# Patient Record
Sex: Male | Born: 1980
Health system: Southern US, Community
[De-identification: ages and names within clinical notes are randomized; demographics above are authoritative.]

## PROBLEM LIST (undated history)

## (undated) DIAGNOSIS — I10 Essential (primary) hypertension: Secondary | ICD-10-CM

## (undated) DIAGNOSIS — K429 Umbilical hernia without obstruction or gangrene: Secondary | ICD-10-CM

## (undated) DIAGNOSIS — E119 Type 2 diabetes mellitus without complications: Secondary | ICD-10-CM

## (undated) HISTORY — PX: HERNIA REPAIR: SHX51

---

## 2020-06-23 DIAGNOSIS — Z720 Tobacco use: Secondary | ICD-10-CM | POA: Diagnosis not present

## 2020-06-23 DIAGNOSIS — I10 Essential (primary) hypertension: Secondary | ICD-10-CM | POA: Diagnosis not present

## 2020-06-23 DIAGNOSIS — K219 Gastro-esophageal reflux disease without esophagitis: Secondary | ICD-10-CM | POA: Diagnosis not present

## 2020-06-23 DIAGNOSIS — G43909 Migraine, unspecified, not intractable, without status migrainosus: Secondary | ICD-10-CM | POA: Diagnosis not present

## 2020-07-05 DIAGNOSIS — E1165 Type 2 diabetes mellitus with hyperglycemia: Secondary | ICD-10-CM | POA: Diagnosis not present

## 2020-07-05 DIAGNOSIS — Z0000 Encounter for general adult medical examination without abnormal findings: Secondary | ICD-10-CM | POA: Diagnosis not present

## 2020-07-05 DIAGNOSIS — Z1321 Encounter for screening for nutritional disorder: Secondary | ICD-10-CM | POA: Diagnosis not present

## 2020-07-08 DIAGNOSIS — E1165 Type 2 diabetes mellitus with hyperglycemia: Secondary | ICD-10-CM | POA: Diagnosis not present

## 2020-07-08 DIAGNOSIS — Z0000 Encounter for general adult medical examination without abnormal findings: Secondary | ICD-10-CM | POA: Diagnosis not present

## 2020-07-08 DIAGNOSIS — G2581 Restless legs syndrome: Secondary | ICD-10-CM | POA: Diagnosis not present

## 2020-07-08 DIAGNOSIS — E782 Mixed hyperlipidemia: Secondary | ICD-10-CM | POA: Diagnosis not present

## 2020-07-21 DIAGNOSIS — E785 Hyperlipidemia, unspecified: Secondary | ICD-10-CM | POA: Diagnosis not present

## 2020-07-21 DIAGNOSIS — E1165 Type 2 diabetes mellitus with hyperglycemia: Secondary | ICD-10-CM | POA: Diagnosis not present

## 2020-08-26 ENCOUNTER — Other Ambulatory Visit: Payer: Self-pay

## 2020-08-26 ENCOUNTER — Emergency Department (HOSPITAL_COMMUNITY): Payer: BC Managed Care – PPO

## 2020-08-26 ENCOUNTER — Observation Stay (HOSPITAL_COMMUNITY)
Admission: EM | Admit: 2020-08-26 | Discharge: 2020-08-28 | Disposition: A | Discharge status: Home / Self Care | Payer: BC Managed Care – PPO | Attending: Surgery | Admitting: Surgery

## 2020-08-26 ENCOUNTER — Encounter (HOSPITAL_COMMUNITY): Payer: Self-pay

## 2020-08-26 DIAGNOSIS — Z6837 Body mass index (BMI) 37.0-37.9, adult: Secondary | ICD-10-CM

## 2020-08-26 DIAGNOSIS — K436 Other and unspecified ventral hernia with obstruction, without gangrene: Secondary | ICD-10-CM

## 2020-08-26 DIAGNOSIS — I10 Essential (primary) hypertension: Secondary | ICD-10-CM | POA: Diagnosis not present

## 2020-08-26 DIAGNOSIS — K432 Incisional hernia without obstruction or gangrene: Principal | ICD-10-CM | POA: Insufficient documentation

## 2020-08-26 DIAGNOSIS — Z79899 Other long term (current) drug therapy: Secondary | ICD-10-CM

## 2020-08-26 DIAGNOSIS — K56609 Unspecified intestinal obstruction, unspecified as to partial versus complete obstruction: Secondary | ICD-10-CM | POA: Diagnosis not present

## 2020-08-26 DIAGNOSIS — K430 Incisional hernia with obstruction, without gangrene: Principal | ICD-10-CM | POA: Diagnosis present

## 2020-08-26 DIAGNOSIS — K6389 Other specified diseases of intestine: Secondary | ICD-10-CM | POA: Diagnosis not present

## 2020-08-26 DIAGNOSIS — Z720 Tobacco use: Secondary | ICD-10-CM

## 2020-08-26 DIAGNOSIS — K439 Ventral hernia without obstruction or gangrene: Secondary | ICD-10-CM | POA: Diagnosis not present

## 2020-08-26 DIAGNOSIS — K219 Gastro-esophageal reflux disease without esophagitis: Secondary | ICD-10-CM | POA: Diagnosis present

## 2020-08-26 DIAGNOSIS — E669 Obesity, unspecified: Secondary | ICD-10-CM | POA: Diagnosis present

## 2020-08-26 DIAGNOSIS — E119 Type 2 diabetes mellitus without complications: Secondary | ICD-10-CM | POA: Diagnosis not present

## 2020-08-26 DIAGNOSIS — Z20822 Contact with and (suspected) exposure to covid-19: Secondary | ICD-10-CM | POA: Diagnosis not present

## 2020-08-26 DIAGNOSIS — K5900 Constipation, unspecified: Secondary | ICD-10-CM | POA: Diagnosis not present

## 2020-08-26 DIAGNOSIS — K5939 Other megacolon: Secondary | ICD-10-CM | POA: Diagnosis not present

## 2020-08-26 DIAGNOSIS — Z7984 Long term (current) use of oral hypoglycemic drugs: Secondary | ICD-10-CM

## 2020-08-26 DIAGNOSIS — R109 Unspecified abdominal pain: Secondary | ICD-10-CM | POA: Diagnosis not present

## 2020-08-26 DIAGNOSIS — F17210 Nicotine dependence, cigarettes, uncomplicated: Secondary | ICD-10-CM | POA: Diagnosis present

## 2020-08-26 DIAGNOSIS — F32A Depression, unspecified: Secondary | ICD-10-CM

## 2020-08-26 DIAGNOSIS — E7800 Pure hypercholesterolemia, unspecified: Secondary | ICD-10-CM

## 2020-08-26 HISTORY — DX: Essential (primary) hypertension: I10

## 2020-08-26 HISTORY — DX: Umbilical hernia without obstruction or gangrene: K42.9

## 2020-08-26 HISTORY — DX: Type 2 diabetes mellitus without complications: E11.9

## 2020-08-26 LAB — CBC
HCT: 44.2 % (ref 39.0–52.0)
Hemoglobin: 14 g/dL (ref 13.0–17.0)
MCH: 26.2 pg (ref 26.0–34.0)
MCHC: 31.7 g/dL (ref 30.0–36.0)
MCV: 82.6 fL (ref 80.0–100.0)
Platelets: 318 10*3/uL (ref 150–400)
RBC: 5.35 MIL/uL (ref 4.22–5.81)
RDW: 14.4 % (ref 11.5–15.5)
WBC: 12.3 10*3/uL — ABNORMAL HIGH (ref 4.0–10.5)
nRBC: 0 % (ref 0.0–0.2)

## 2020-08-26 LAB — URINALYSIS, ROUTINE W REFLEX MICROSCOPIC
Bacteria, UA: NONE SEEN
Bilirubin Urine: NEGATIVE
Glucose, UA: NEGATIVE mg/dL
Hgb urine dipstick: NEGATIVE
Ketones, ur: 20 mg/dL — AB
Leukocytes,Ua: NEGATIVE
Nitrite: NEGATIVE
Protein, ur: 30 mg/dL — AB
Specific Gravity, Urine: 1.026 (ref 1.005–1.030)
pH: 6 (ref 5.0–8.0)

## 2020-08-26 LAB — COMPREHENSIVE METABOLIC PANEL
ALT: 56 U/L — ABNORMAL HIGH (ref 0–44)
AST: 31 U/L (ref 15–41)
Albumin: 4.1 g/dL (ref 3.5–5.0)
Alkaline Phosphatase: 81 U/L (ref 38–126)
Anion gap: 11 (ref 5–15)
BUN: 15 mg/dL (ref 6–20)
CO2: 25 mmol/L (ref 22–32)
Calcium: 9.7 mg/dL (ref 8.9–10.3)
Chloride: 100 mmol/L (ref 98–111)
Creatinine, Ser: 0.94 mg/dL (ref 0.61–1.24)
GFR, Estimated: >60 mL/min (ref >60)
Glucose, Bld: 136 mg/dL — ABNORMAL HIGH (ref 70–99)
Potassium: 3.9 mmol/L (ref 3.5–5.1)
Sodium: 136 mmol/L (ref 135–145)
Total Bilirubin: 0.4 mg/dL (ref 0.3–1.2)
Total Protein: 8 g/dL (ref 6.5–8.1)

## 2020-08-26 LAB — LIPASE, BLOOD: Lipase: 51 U/L (ref 11–51)

## 2020-08-26 LAB — CBG MONITORING, ED: Glucose-Capillary: 114 mg/dL — ABNORMAL HIGH (ref 70–99)

## 2020-08-26 MED ORDER — SODIUM CHLORIDE 0.9 % IV BOLUS
1000.0000 mL | Freq: Once | INTRAVENOUS | Status: AC
  Administered 2020-08-26: 1000 mL via INTRAVENOUS

## 2020-08-26 MED ORDER — ONDANSETRON HCL 4 MG/2ML IJ SOLN
4.0000 mg | Freq: Four times a day (QID) | INTRAMUSCULAR | Status: DC | PRN
  Administered 2020-08-26: 4 mg via INTRAVENOUS
  Filled 2020-08-26: qty 2

## 2020-08-26 MED ORDER — ONDANSETRON 4 MG PO TBDP: 4.0000 mg | ORAL_TABLET | Freq: Four times a day (QID) | ORAL | Status: DC | PRN

## 2020-08-26 MED ORDER — LISINOPRIL-HYDROCHLOROTHIAZIDE 20-12.5 MG PO TABS: 1.0000 | ORAL_TABLET | Freq: Every day | ORAL | Status: DC

## 2020-08-26 MED ORDER — METOPROLOL SUCCINATE ER 50 MG PO TB24
50.0000 mg | ORAL_TABLET | Freq: Every evening | ORAL | Status: DC
  Administered 2020-08-26: 50 mg via ORAL
  Filled 2020-08-26 (×2): qty 1

## 2020-08-26 MED ORDER — LISINOPRIL 20 MG PO TABS
20.0000 mg | ORAL_TABLET | Freq: Every day | ORAL | Status: DC
  Administered 2020-08-26 – 2020-08-27 (×2): 20 mg via ORAL
  Filled 2020-08-26 (×2): qty 1

## 2020-08-26 MED ORDER — METFORMIN HCL 500 MG PO TABS
500.0000 mg | ORAL_TABLET | Freq: Every day | ORAL | Status: DC
  Administered 2020-08-27: 500 mg via ORAL
  Filled 2020-08-26: qty 1

## 2020-08-26 MED ORDER — INSULIN ASPART 100 UNIT/ML IJ SOLN
0.0000 [IU] | INTRAMUSCULAR | Status: DC
Start: 2020-08-26 — End: 2020-08-28
  Administered 2020-08-27: 5 [IU] via SUBCUTANEOUS
  Administered 2020-08-27: 3 [IU] via SUBCUTANEOUS
  Administered 2020-08-27: 5 [IU] via SUBCUTANEOUS
  Administered 2020-08-28 (×2): 2 [IU] via SUBCUTANEOUS
  Administered 2020-08-28: 5 [IU] via SUBCUTANEOUS
  Filled 2020-08-26: qty 0.15

## 2020-08-26 MED ORDER — ONDANSETRON HCL 4 MG/2ML IJ SOLN
4.0000 mg | Freq: Once | INTRAMUSCULAR | Status: AC
  Administered 2020-08-26: 4 mg via INTRAVENOUS
  Filled 2020-08-26: qty 2

## 2020-08-26 MED ORDER — PANTOPRAZOLE SODIUM 40 MG IV SOLR
40.0000 mg | INTRAVENOUS | Status: DC
  Administered 2020-08-26 – 2020-08-27 (×2): 40 mg via INTRAVENOUS
  Filled 2020-08-26 (×2): qty 40

## 2020-08-26 MED ORDER — AMITRIPTYLINE HCL 10 MG PO TABS
10.0000 mg | ORAL_TABLET | Freq: Every day | ORAL | Status: DC
  Administered 2020-08-26 – 2020-08-27 (×2): 10 mg via ORAL
  Filled 2020-08-26 (×2): qty 1

## 2020-08-26 MED ORDER — ACETAMINOPHEN 650 MG RE SUPP: 650.0000 mg | Freq: Four times a day (QID) | RECTAL | Status: DC | PRN

## 2020-08-26 MED ORDER — OXYCODONE HCL 5 MG PO TABS
5.0000 mg | ORAL_TABLET | ORAL | Status: DC | PRN
  Administered 2020-08-27: 5 mg via ORAL
  Filled 2020-08-26: qty 1
  Filled 2020-08-26: qty 2

## 2020-08-26 MED ORDER — HYDROCHLOROTHIAZIDE 12.5 MG PO CAPS
12.5000 mg | ORAL_CAPSULE | Freq: Every day | ORAL | Status: DC
  Administered 2020-08-26 – 2020-08-27 (×2): 12.5 mg via ORAL
  Filled 2020-08-26 (×2): qty 1

## 2020-08-26 MED ORDER — ONDANSETRON 4 MG PO TBDP: 4.0000 mg | ORAL_TABLET | Freq: Once | ORAL | Status: DC

## 2020-08-26 MED ORDER — POTASSIUM CHLORIDE IN NACL 20-0.45 MEQ/L-% IV SOLN
INTRAVENOUS | Status: DC
  Filled 2020-08-26 (×4): qty 1000

## 2020-08-26 MED ORDER — ACETAMINOPHEN 325 MG PO TABS: 650.0000 mg | ORAL_TABLET | Freq: Four times a day (QID) | ORAL | Status: DC | PRN

## 2020-08-26 MED ORDER — HYDROMORPHONE HCL 1 MG/ML IJ SOLN
1.0000 mg | INTRAMUSCULAR | Status: DC | PRN
  Administered 2020-08-26: 1 mg via INTRAVENOUS
  Administered 2020-08-27 (×2): .5 mg via INTRAVENOUS
  Administered 2020-08-27: 1 mg via INTRAVENOUS
  Filled 2020-08-26 (×2): qty 1

## 2020-08-26 NOTE — ED Provider Notes (Signed)
Emergency Medicine Provider Triage Evaluation Note  Gregory Castillo , a 40 y.o. male  was evaluated in triage.  Pt complains of constipation, periumbilical abdominal pain. Hx of ventral hernia repair. N with single episode of NBNB emesis. No fevers or chills. Onset of pain today. Constipation x 3 days.   Review of Systems  Positive: Abdominal pain, constipation, N, V Negative: D, melena or hematochezia, SOB, CP  Physical Exam  BP (!) 150/93 (BP Location: Right Arm)   Pulse (!) 101   Temp 98.1 F (36.7 C) (Oral)   Resp 18   Ht 5\' 11"  (1.803 m)   Wt 123.5 kg   SpO2 99%   BMI 37.96 kg/m  Gen:   Awake, no distress   Resp:  Normal effort  MSK:   Moves extremities without difficulty  Other:  Surgical scar above umbilicus, bulge above umbilicus with TTP  Medical Decision Making  Medically screening exam initiated at 4:42 PM.  Appropriate orders placed.  Gregory Castillo was informed that the remainder of the evaluation will be completed by another provider, this initial triage assessment does not replace that evaluation, and the importance of remaining in the ED until their evaluation is complete.  This chart was dictated using voice recognition software, Dragon. Despite the best efforts of this provider to proofread and correct errors, errors may still occur which can change documentation meaning.    Renato Shin 08/26/20 1645    08/28/20, MD 08/26/20 2252

## 2020-08-26 NOTE — ED Provider Notes (Signed)
[facility]-EMERGENCY DEPT Provider Note   CSN: [id number] Arrival date & time: 08/26/20  1538     History Chief Complaint  Patient presents with  . Abdominal Pain    Gregory Castillo is a 40 y.o. male.  Patient presents with 'recurrent hernia', pain at site and nausea/vomiting. States ~ 1 year ago had umbilical hernia repair in [place]. States he was moving shortly thereafter and had done some lifting in that week after his surgery then. Pt felt his hernia had recurred with occasional bulging in area. In past few days, some straining to have bm as felt mildly constipated. Did have bm this AM, but has noticed persistent pain at hernia site, w persistent bulge, and episodes of nausea/vomiting today. Emesis not bloody or bilious. No fever or chills.   The history is provided by the patient.  Abdominal Pain Associated symptoms: nausea and vomiting   Associated symptoms: no chest pain, no fever, no shortness of breath and no sore throat        Past Medical History:  Diagnosis Date  . Diabetes mellitus without complication (HCC)   . Hypertension   . Umbilical hernia     There are no problems to display for this patient.   Past Surgical History:  Procedure Laterality Date  . HERNIA REPAIR         Family History  Family history unknown: Yes    Social History   Tobacco Use  . Smoking status: Current Every Day Smoker    Packs/day: 1.00    Types: Cigarettes  . Smokeless tobacco: Never Used  Vaping Use  . Vaping Use: Never used  Substance Use Topics  . Alcohol use: Never  . Drug use: Never    Home Medications Prior to Admission medications   Not on File    Allergies    Patient has no known allergies.  Review of Systems   Review of Systems  Constitutional: Negative for fever.  HENT: Negative for sore throat.   Eyes: Negative for redness.  Respiratory: Negative for shortness of breath.   Cardiovascular: Negative for chest pain.   Gastrointestinal: Positive for abdominal pain, nausea and vomiting.  Genitourinary: Negative for flank pain.  Musculoskeletal: Negative for back pain and neck pain.  Skin: Negative for rash.  Neurological: Negative for headaches.  Hematological: Does not bruise/bleed easily.  Psychiatric/Behavioral: Negative for confusion.    Physical Exam Updated Vital Signs BP 136/83   Pulse 87   Temp 98.1 F (36.7 C) (Oral)   Resp 16   Ht 1.803 m (5\' 11" )   Wt 123.5 kg   SpO2 94%   BMI 37.96 kg/m   Physical Exam Vitals and nursing note reviewed.  Constitutional:      Appearance: Normal appearance. He is well-developed.  HENT:     Head: Atraumatic.     Nose: Nose normal.     Mouth/Throat:     Mouth: Mucous membranes are moist.     Pharynx: Oropharynx is clear.  Eyes:     General: No scleral icterus.    Conjunctiva/sclera: Conjunctivae normal.  Neck:     Trachea: No tracheal deviation.  Cardiovascular:     Rate and Rhythm: Normal rate and regular rhythm.     Pulses: Normal pulses.     Heart sounds: Normal heart sounds. No murmur heard. No friction rub. No gallop.   Pulmonary:     Effort: Pulmonary effort is normal. No accessory muscle usage or respiratory distress.  Breath sounds: Normal breath sounds.  Abdominal:     General: Bowel sounds are normal. There is no distension.     Palpations: Abdomen is soft. There is no mass.     Tenderness: There is abdominal tenderness. There is no guarding or rebound.     Hernia: A hernia is present.     Comments: Tender, non-reducible hernia just above umbilicus.   Genitourinary:    Comments: No cva tenderness. Musculoskeletal:        General: No swelling.     Cervical back: Normal range of motion and neck supple. No rigidity.  Skin:    General: Skin is warm and dry.     Findings: No rash.  Neurological:     Mental Status: He is alert.     Comments: Alert, speech clear.   Psychiatric:        Mood and Affect: Mood normal.      ED Results / Procedures / Treatments   Labs (all labs ordered are listed, but only abnormal results are displayed) Results for orders placed or performed during the hospital encounter of 08/26/20  Lipase, blood  Result Value Ref Range   Lipase 51 11 - 51 U/L  Comprehensive metabolic panel  Result Value Ref Range   Sodium 136 135 - 145 mmol/L   Potassium 3.9 3.5 - 5.1 mmol/L   Chloride 100 98 - 111 mmol/L   CO2 25 22 - 32 mmol/L   Glucose, Bld 136 (H) 70 - 99 mg/dL   BUN 15 6 - 20 mg/dL   Creatinine, Ser 3.74 0.61 - 1.24 mg/dL   Calcium 9.7 8.9 - 82.7 mg/dL   Total Protein 8.0 6.5 - 8.1 g/dL   Albumin 4.1 3.5 - 5.0 g/dL   AST 31 15 - 41 U/L   ALT 56 (H) 0 - 44 U/L   Alkaline Phosphatase 81 38 - 126 U/L   Total Bilirubin 0.4 0.3 - 1.2 mg/dL   GFR, Estimated >07 >86 mL/min   Anion gap 11 5 - 15  CBC  Result Value Ref Range   WBC 12.3 (H) 4.0 - 10.5 K/uL   RBC 5.35 4.22 - 5.81 MIL/uL   Hemoglobin 14.0 13.0 - 17.0 g/dL   HCT 75.4 49.2 - 01.0 %   MCV 82.6 80.0 - 100.0 fL   MCH 26.2 26.0 - 34.0 pg   MCHC 31.7 30.0 - 36.0 g/dL   RDW 07.1 21.9 - 75.8 %   Platelets 318 150 - 400 K/uL   nRBC 0.0 0.0 - 0.2 %  Urinalysis, Routine w reflex microscopic Urine, Clean Catch  Result Value Ref Range   Color, Urine YELLOW YELLOW   APPearance CLEAR CLEAR   Specific Gravity, Urine 1.026 1.005 - 1.030   pH 6.0 5.0 - 8.0   Glucose, UA NEGATIVE NEGATIVE mg/dL   Hgb urine dipstick NEGATIVE NEGATIVE   Bilirubin Urine NEGATIVE NEGATIVE   Ketones, ur 20 (A) NEGATIVE mg/dL   Protein, ur 30 (A) NEGATIVE mg/dL   Nitrite NEGATIVE NEGATIVE   Leukocytes,Ua NEGATIVE NEGATIVE   RBC / HPF 0-5 0 - 5 RBC/hpf   WBC, UA 0-5 0 - 5 WBC/hpf   Bacteria, UA NONE SEEN NONE SEEN   Mucus PRESENT    Hyaline Casts, UA PRESENT    CT Abdomen Pelvis Wo Contrast  Result Date: 08/26/2020 CLINICAL DATA:  Periumbilical abdominal pain, constipation. EXAM: CT ABDOMEN AND PELVIS WITHOUT CONTRAST TECHNIQUE:  Multidetector CT imaging of the abdomen and pelvis  was performed following the standard protocol without IV contrast. COMPARISON:  None. FINDINGS: Lower chest: No acute abnormality. Hepatobiliary: No focal liver abnormality is seen. No gallstones, gallbladder wall thickening, or biliary dilatation. Pancreas: Unremarkable. No pancreatic ductal dilatation or surrounding inflammatory changes. Spleen: Normal in size without focal abnormality. Adrenals/Urinary Tract: Adrenal glands are unremarkable. Kidneys are normal, without renal calculi, focal lesion, or hydronephrosis. Bladder is unremarkable. Stomach/Bowel: The stomach appears normal. Diverticulosis is noted throughout the colon without inflammation. Large supraumbilical ventral hernia is noted which contains a loop of small bowel; this results in mild dilatation of the more proximal small bowel loops suggesting some degree of obstruction. Vascular/Lymphatic: No significant vascular findings are present. No enlarged abdominal or pelvic lymph nodes. Reproductive: Prostate is unremarkable. Other: No ascites is noted. Musculoskeletal: No acute or significant osseous findings. IMPRESSION: Large supraumbilical ventral hernia is noted which contains a loop of small bowel, which results in mild dilatation of the more proximal small bowel loop suggesting some degree of obstruction. Diverticulosis is noted throughout the colon without inflammation. Electronically Signed   By: Lupita Raider M.D.   On: 08/26/2020 17:34    EKG None  Radiology CT Abdomen Pelvis Wo Contrast  Result Date: 08/26/2020 CLINICAL DATA:  Periumbilical abdominal pain, constipation. EXAM: CT ABDOMEN AND PELVIS WITHOUT CONTRAST TECHNIQUE: Multidetector CT imaging of the abdomen and pelvis was performed following the standard protocol without IV contrast. COMPARISON:  None. FINDINGS: Lower chest: No acute abnormality. Hepatobiliary: No focal liver abnormality is seen. No gallstones, gallbladder  wall thickening, or biliary dilatation. Pancreas: Unremarkable. No pancreatic ductal dilatation or surrounding inflammatory changes. Spleen: Normal in size without focal abnormality. Adrenals/Urinary Tract: Adrenal glands are unremarkable. Kidneys are normal, without renal calculi, focal lesion, or hydronephrosis. Bladder is unremarkable. Stomach/Bowel: The stomach appears normal. Diverticulosis is noted throughout the colon without inflammation. Large supraumbilical ventral hernia is noted which contains a loop of small bowel; this results in mild dilatation of the more proximal small bowel loops suggesting some degree of obstruction. Vascular/Lymphatic: No significant vascular findings are present. No enlarged abdominal or pelvic lymph nodes. Reproductive: Prostate is unremarkable. Other: No ascites is noted. Musculoskeletal: No acute or significant osseous findings. IMPRESSION: Large supraumbilical ventral hernia is noted which contains a loop of small bowel, which results in mild dilatation of the more proximal small bowel loop suggesting some degree of obstruction. Diverticulosis is noted throughout the colon without inflammation. Electronically Signed   By: Lupita Raider M.D.   On: 08/26/2020 17:34    Procedures Procedures   Medications Ordered in ED Medications  ondansetron (ZOFRAN) injection 4 mg (has no administration in time range)  sodium chloride 0.9 % bolus 1,000 mL (has no administration in time range)    ED Course  I have reviewed the triage vital signs and the nursing notes.  Pertinent labs & imaging results that were available during my care of the patient were reviewed by me and considered in my medical decision making (see chart for details).    MDM Rules/Calculators/A&P                         Iv ns bolus. zofran iv. Labs and imaging.   Reviewed nursing notes and prior charts for additional history.   Labs reviewed/interpreted by me - wbc mildly elevated, 12.   CT  reviewed/interpreted by me - ventral hernia w ?dilated loop bowel in hernia.   Attempt to reduce - even with  very gentle manipulation pt tolerates very poorly, requests abort.  Pt declines any opiate type pain medication.   General surgery consulted. Discussed w Dr Gerrit FriendsGerkin - he will see in ED/admit, possible OR.     Final Clinical Impression(s) / ED Diagnoses Final diagnoses:  None    Rx / DC Orders ED Discharge Orders    None       Cathren LaineSteinl, Reilly Molchan, MD 08/27/20 1015

## 2020-08-26 NOTE — H&P (Addendum)
Gregory Castillo is an 40 y.o. male.    General Surgery [facility]  Chief Complaint: abdominal pain, nausea, emesis  HPI: Patient is a 40 year old male who presents to the emergency department with onset of mid abdominal pain, nausea, and emesis due to a ventral hernia.  Patient has a history of hernia repair in [place], [place], approximately 1 year ago.  Patient and his wife do not believe that mesh was used in the repair.  He apparently had a recurrence relatively shortly after repair.  This is gradually enlarged.  Over the past few days the patient has suffered from constipation.  While straining with bowel movement he experienced onset of acute pain at the site of his hernia.  This has persisted.  Today he developed nausea and an episode of emesis.  He presented to the emergency department for evaluation.  CT scan of the abdomen and pelvis shows an incisional hernia in the midline of the abdomen above the level of the umbilicus containing a loop of small intestine with possible partial obstruction.  General surgery is now called for management.  Past medical history is notable for diabetes.  Patient has not had other abdominal surgery.  Patient works for a [employer].  Past Medical History:  Diagnosis Date  . Diabetes mellitus without complication (HCC)   . Hypertension   . Umbilical hernia     Past Surgical History:  Procedure Laterality Date  . HERNIA REPAIR      Family History  Family history unknown: Yes   Social History:  reports that he has been smoking cigarettes. He has been smoking about 1.00 pack per day. He has never used smokeless tobacco. He reports that he does not drink alcohol and does not use drugs.  Allergies: No Known Allergies  (Not in a hospital admission)   Results for orders placed or performed during the hospital encounter of 08/26/20 (from the past 48 hour(s))  Urinalysis, Routine w reflex microscopic Urine,  Clean Catch     Status: Abnormal   Collection Time: 08/26/20  4:08 PM  Result Value Ref Range   Color, Urine YELLOW YELLOW   APPearance CLEAR CLEAR   Specific Gravity, Urine 1.026 1.005 - 1.030   pH 6.0 5.0 - 8.0   Glucose, UA NEGATIVE NEGATIVE mg/dL   Hgb urine dipstick NEGATIVE NEGATIVE   Bilirubin Urine NEGATIVE NEGATIVE   Ketones, ur 20 (A) NEGATIVE mg/dL   Protein, ur 30 (A) NEGATIVE mg/dL   Nitrite NEGATIVE NEGATIVE   Leukocytes,Ua NEGATIVE NEGATIVE   RBC / HPF 0-5 0 - 5 RBC/hpf   WBC, UA 0-5 0 - 5 WBC/hpf   Bacteria, UA NONE SEEN NONE SEEN   Mucus PRESENT    Hyaline Casts, UA PRESENT     Comment: Performed at [facility], [address], [place], [place] [address]  Lipase, blood     Status: None   Collection Time: 08/26/20  4:16 PM  Result Value Ref Range   Lipase 51 11 - 51 U/L    Comment: Performed at [facility], [address] [place], [place] [address]  Comprehensive metabolic panel     Status: Abnormal   Collection Time: 08/26/20  4:16 PM  Result Value Ref Range   Sodium 136 135 - 145 mmol/L   Potassium 3.9 3.5 - 5.1 mmol/L   Chloride 100 98 - 111 mmol/L   CO2 25 22 - 32 mmol/L   Glucose,  Bld 136 (H) 70 - 99 mg/dL    Comment: Glucose reference range applies only to samples taken after fasting for at least 8 hours.   BUN 15 6 - 20 mg/dL   Creatinine, Ser 1.63 0.61 - 1.24 mg/dL   Calcium 9.7 8.9 - 84.6 mg/dL   Total Protein 8.0 6.5 - 8.1 g/dL   Albumin 4.1 3.5 - 5.0 g/dL   AST 31 15 - 41 U/L   ALT 56 (H) 0 - 44 U/L   Alkaline Phosphatase 81 38 - 126 U/L   Total Bilirubin 0.4 0.3 - 1.2 mg/dL   GFR, Estimated >65 >99 mL/min    Comment: (NOTE) Calculated using the CKD-EPI Creatinine Equation (2021)    Anion gap 11 5 - 15    Comment: Performed at [facility], [address], [place], [place] [address]  CBC     Status: Abnormal   Collection Time: 08/26/20  4:16 PM  Result Value Ref Range   WBC 12.3  (H) 4.0 - 10.5 K/uL   RBC 5.35 4.22 - 5.81 MIL/uL   Hemoglobin 14.0 13.0 - 17.0 g/dL   HCT 77.9 39.0 - 30.0 %   MCV 82.6 80.0 - 100.0 fL   MCH 26.2 26.0 - 34.0 pg   MCHC 31.7 30.0 - 36.0 g/dL   RDW 92.3 30.0 - 76.2 %   Platelets 318 150 - 400 K/uL   nRBC 0.0 0.0 - 0.2 %    Comment: Performed at [facility], [address], [place], [place] [address]   CT Abdomen Pelvis Wo Contrast  Result Date: 08/26/2020 CLINICAL DATA:  Periumbilical abdominal pain, constipation. EXAM: CT ABDOMEN AND PELVIS WITHOUT CONTRAST TECHNIQUE: Multidetector CT imaging of the abdomen and pelvis was performed following the standard protocol without IV contrast. COMPARISON:  None. FINDINGS: Lower chest: No acute abnormality. Hepatobiliary: No focal liver abnormality is seen. No gallstones, gallbladder wall thickening, or biliary dilatation. Pancreas: Unremarkable. No pancreatic ductal dilatation or surrounding inflammatory changes. Spleen: Normal in size without focal abnormality. Adrenals/Urinary Tract: Adrenal glands are unremarkable. Kidneys are normal, without renal calculi, focal lesion, or hydronephrosis. Bladder is unremarkable. Stomach/Bowel: The stomach appears normal. Diverticulosis is noted throughout the colon without inflammation. Large supraumbilical ventral hernia is noted which contains a loop of small bowel; this results in mild dilatation of the more proximal small bowel loops suggesting some degree of obstruction. Vascular/Lymphatic: No significant vascular findings are present. No enlarged abdominal or pelvic lymph nodes. Reproductive: Prostate is unremarkable. Other: No ascites is noted. Musculoskeletal: No acute or significant osseous findings. IMPRESSION: Large supraumbilical ventral hernia is noted which contains a loop of small bowel, which results in mild dilatation of the more proximal small bowel loop suggesting some degree of obstruction. Diverticulosis is noted throughout the  colon without inflammation. Electronically Signed   By: Lupita Raider M.D.   On: 08/26/2020 17:34    Review of Systems  Constitutional: Positive for appetite change.  HENT: Negative.   Eyes: Negative.   Respiratory: Negative.   Cardiovascular: Negative.   Gastrointestinal: Positive for abdominal pain, constipation, nausea and vomiting.  Endocrine: Negative.   Genitourinary: Negative.   Musculoskeletal: Negative.   Skin: Negative.   Allergic/Immunologic: Negative.   Neurological: Negative.   Hematological: Negative.   Psychiatric/Behavioral: Negative.     Physical Exam   Blood pressure 134/89, pulse 80, temperature 98.1 F (36.7 C), temperature source Oral, resp. rate 18, height 5\' 11"  (1.803 m), weight 123.5 kg,  SpO2 94 %.  CONSTITUTIONAL: no acute distress; conversant; no obvious deformities  EYES: conjunctiva moist; no lid lag; anicteric; pupils equal bilaterally  NECK: trachea midline; no thyroid nodularity  LUNGS: respiratory effort normal & unlabored; no wheeze; no rales; no tactile fremitus  CV: rate and rhythm regular; no palpable thrills; no murmur; no edema bilat lower extremities  GI: abdomen is soft, obese; there is a well-healed surgical wound in the midline above the level of the umbilicus over a distance of approximately 5 to 6 cm; there is a visible bulge just to the left of the incision and to the midline; attempts at palpation or resisted by the patient with voluntary guarding and complaints of tenderness; no hepatosplenomegaly  MSK: normal range of motion of extremities; no clubbing; no cyanosis  PSYCH: appropriate affect for situation; alert and oriented to person, place, & time  LYMPHATIC: no palpable cervical lymphadenopathy; no evidence lymphedema in extremities   Assessment/Plan Incarcerated ventral incisional hernia with partial small bowel obstruction Diabetes mellitus Hypertension Obesity  Patient will be admitted to the general  surgery service.  He will be kept on intravenous fluids and made n.p.o.  At this time we will hold off on nasogastric tube placement.  Patient will not allow palpation or manipulation of the hernia due to discomfort.  If there is no significant improvement overnight with bedrest, then I think we will need to consider operative repair.  I discussed this with the patient and his wife.  We discussed the potential use of prosthetic mesh.  We discussed open repair versus laparoscopic repair.  The patient does not believe that mesh was actually used in his initial repair although he is not certain.  On review of his CT scan I do not see any evidence of mesh.  I will review the CT scan with my partner, Dr. Estelle Grumbles, who is an expert in laparoscopic repair.  We will make a decision in the morning regarding the best technique for repair if surgery is indicated.  Darnell Level, MD [facility] Office: [phone]    Darnell Level, MD 08/26/2020, 9:12 PM

## 2020-08-26 NOTE — ED Triage Notes (Signed)
Patient c/o umbilical hernia pain, nausea, and constipation x 3 days. Patient had surgery to the hernia with mesh last year.

## 2020-08-27 ENCOUNTER — Observation Stay (HOSPITAL_COMMUNITY): Payer: BC Managed Care – PPO | Admitting: Anesthesiology

## 2020-08-27 ENCOUNTER — Encounter (HOSPITAL_COMMUNITY)
Admission: EM | Disposition: A | Discharge status: Home / Self Care | Payer: Self-pay | Source: Home / Self Care | Attending: Emergency Medicine

## 2020-08-27 DIAGNOSIS — K56609 Unspecified intestinal obstruction, unspecified as to partial versus complete obstruction: Secondary | ICD-10-CM

## 2020-08-27 DIAGNOSIS — E7800 Pure hypercholesterolemia, unspecified: Secondary | ICD-10-CM | POA: Diagnosis not present

## 2020-08-27 DIAGNOSIS — K430 Incisional hernia with obstruction, without gangrene: Secondary | ICD-10-CM | POA: Diagnosis not present

## 2020-08-27 DIAGNOSIS — Z720 Tobacco use: Secondary | ICD-10-CM

## 2020-08-27 DIAGNOSIS — F32A Depression, unspecified: Secondary | ICD-10-CM

## 2020-08-27 DIAGNOSIS — E119 Type 2 diabetes mellitus without complications: Secondary | ICD-10-CM | POA: Diagnosis not present

## 2020-08-27 DIAGNOSIS — E669 Obesity, unspecified: Secondary | ICD-10-CM

## 2020-08-27 DIAGNOSIS — I10 Essential (primary) hypertension: Secondary | ICD-10-CM | POA: Diagnosis present

## 2020-08-27 HISTORY — PX: INCISIONAL HERNIA REPAIR: SHX193

## 2020-08-27 LAB — SURGICAL PCR SCREEN
MRSA, PCR: NEGATIVE
Staphylococcus aureus: POSITIVE — AB

## 2020-08-27 LAB — GLUCOSE, CAPILLARY
Glucose-Capillary: 117 mg/dL — ABNORMAL HIGH (ref 70–99)
Glucose-Capillary: 120 mg/dL — ABNORMAL HIGH (ref 70–99)
Glucose-Capillary: 125 mg/dL — ABNORMAL HIGH (ref 70–99)
Glucose-Capillary: 151 mg/dL — ABNORMAL HIGH (ref 70–99)
Glucose-Capillary: 188 mg/dL — ABNORMAL HIGH (ref 70–99)
Glucose-Capillary: 214 mg/dL — ABNORMAL HIGH (ref 70–99)
Glucose-Capillary: 230 mg/dL — ABNORMAL HIGH (ref 70–99)

## 2020-08-27 LAB — RESP PANEL BY RT-PCR (FLU A&B, COVID) ARPGX2
Influenza A by PCR: NEGATIVE
Influenza B by PCR: NEGATIVE
SARS Coronavirus 2 by RT PCR: NEGATIVE

## 2020-08-27 LAB — HIV ANTIBODY (ROUTINE TESTING W REFLEX): HIV Screen 4th Generation wRfx: NONREACTIVE

## 2020-08-27 LAB — HEMOGLOBIN A1C
Hgb A1c MFr Bld: 7.5 % — ABNORMAL HIGH (ref 4.8–5.6)
Mean Plasma Glucose: 168.55 mg/dL

## 2020-08-27 SURGERY — REPAIR, HERNIA, INCISIONAL, LAPAROSCOPIC
Anesthesia: General | Site: Abdomen

## 2020-08-27 MED ORDER — GABAPENTIN 300 MG PO CAPS
300.0000 mg | ORAL_CAPSULE | Freq: Every day | ORAL | Status: DC
  Administered 2020-08-27: 300 mg via ORAL
  Filled 2020-08-27: qty 1

## 2020-08-27 MED ORDER — CHLORHEXIDINE GLUCONATE CLOTH 2 % EX PADS
6.0000 | MEDICATED_PAD | Freq: Once | CUTANEOUS | Status: AC
  Administered 2020-08-27: 6 via TOPICAL

## 2020-08-27 MED ORDER — KETOROLAC TROMETHAMINE 30 MG/ML IJ SOLN
INTRAMUSCULAR | Status: DC | PRN
  Administered 2020-08-27: 30 mg via INTRAVENOUS

## 2020-08-27 MED ORDER — CELECOXIB 200 MG PO CAPS: 200.0000 mg | ORAL_CAPSULE | ORAL | Status: AC

## 2020-08-27 MED ORDER — SUCCINYLCHOLINE CHLORIDE 200 MG/10ML IV SOSY
PREFILLED_SYRINGE | INTRAVENOUS | Status: AC
  Filled 2020-08-27: qty 10

## 2020-08-27 MED ORDER — PHENYLEPHRINE 40 MCG/ML (10ML) SYRINGE FOR IV PUSH (FOR BLOOD PRESSURE SUPPORT)
PREFILLED_SYRINGE | INTRAVENOUS | Status: DC | PRN
  Administered 2020-08-27 (×2): 80 ug via INTRAVENOUS
  Administered 2020-08-27 (×2): 120 ug via INTRAVENOUS
  Administered 2020-08-27: 80 ug via INTRAVENOUS
  Administered 2020-08-27 (×2): 120 ug via INTRAVENOUS

## 2020-08-27 MED ORDER — SODIUM CHLORIDE 0.9 % IV SOLN: 250.0000 mL | INTRAVENOUS | Status: DC | PRN

## 2020-08-27 MED ORDER — SIMETHICONE 40 MG/0.6ML PO SUSP
80.0000 mg | Freq: Four times a day (QID) | ORAL | Status: DC | PRN
  Filled 2020-08-27: qty 1.2

## 2020-08-27 MED ORDER — CEFAZOLIN SODIUM-DEXTROSE 2-4 GM/100ML-% IV SOLN
2.0000 g | Freq: Three times a day (TID) | INTRAVENOUS | Status: AC
  Administered 2020-08-27 – 2020-08-28 (×2): 2 g via INTRAVENOUS
  Filled 2020-08-27 (×2): qty 100

## 2020-08-27 MED ORDER — MEPERIDINE HCL 50 MG/ML IJ SOLN: 6.2500 mg | INTRAMUSCULAR | Status: DC | PRN

## 2020-08-27 MED ORDER — METRONIDAZOLE 500 MG/100ML IV SOLN
INTRAVENOUS | Status: DC | PRN
  Administered 2020-08-27: 500 mg via INTRAVENOUS

## 2020-08-27 MED ORDER — ENSURE PRE-SURGERY PO LIQD
592.0000 mL | Freq: Once | ORAL | Status: DC
  Filled 2020-08-27: qty 592

## 2020-08-27 MED ORDER — LIP MEDEX EX OINT
1.0000 "application " | TOPICAL_OINTMENT | Freq: Two times a day (BID) | CUTANEOUS | Status: DC
  Administered 2020-08-27 – 2020-08-28 (×3): 1 via TOPICAL
  Filled 2020-08-27: qty 7

## 2020-08-27 MED ORDER — ONDANSETRON HCL 4 MG/2ML IJ SOLN
INTRAMUSCULAR | Status: DC | PRN
  Administered 2020-08-27: 4 mg via INTRAVENOUS

## 2020-08-27 MED ORDER — SUGAMMADEX SODIUM 200 MG/2ML IV SOLN
INTRAVENOUS | Status: DC | PRN
  Administered 2020-08-27: 300 mg via INTRAVENOUS

## 2020-08-27 MED ORDER — CEFAZOLIN IN SODIUM CHLORIDE 3-0.9 GM/100ML-% IV SOLN
3.0000 g | INTRAVENOUS | Status: AC
  Administered 2020-08-27: 3 g via INTRAVENOUS
  Filled 2020-08-27 (×2): qty 100

## 2020-08-27 MED ORDER — PROCHLORPERAZINE EDISYLATE 10 MG/2ML IJ SOLN: 5.0000 mg | INTRAMUSCULAR | Status: DC | PRN

## 2020-08-27 MED ORDER — LACTATED RINGERS IV SOLN: INTRAVENOUS | Status: DC | PRN

## 2020-08-27 MED ORDER — FENTANYL CITRATE (PF) 250 MCG/5ML IJ SOLN
INTRAMUSCULAR | Status: AC
  Filled 2020-08-27: qty 5

## 2020-08-27 MED ORDER — HYDROMORPHONE HCL 1 MG/ML IJ SOLN: 0.2500 mg | INTRAMUSCULAR | Status: DC | PRN

## 2020-08-27 MED ORDER — PHENYLEPHRINE 40 MCG/ML (10ML) SYRINGE FOR IV PUSH (FOR BLOOD PRESSURE SUPPORT)
PREFILLED_SYRINGE | INTRAVENOUS | Status: AC
  Filled 2020-08-27: qty 10

## 2020-08-27 MED ORDER — MENTHOL 3 MG MT LOZG: 1.0000 | LOZENGE | OROMUCOSAL | Status: DC | PRN

## 2020-08-27 MED ORDER — IBUPROFEN 200 MG PO TABS
400.0000 mg | ORAL_TABLET | Freq: Four times a day (QID) | ORAL | Status: DC | PRN
  Administered 2020-08-27: 400 mg via ORAL
  Filled 2020-08-27: qty 2

## 2020-08-27 MED ORDER — METOPROLOL TARTRATE 12.5 MG HALF TABLET: 12.5000 mg | ORAL_TABLET | Freq: Two times a day (BID) | ORAL | Status: DC | PRN

## 2020-08-27 MED ORDER — ATORVASTATIN CALCIUM 10 MG PO TABS
10.0000 mg | ORAL_TABLET | Freq: Every day | ORAL | Status: DC
  Administered 2020-08-27: 10 mg via ORAL
  Filled 2020-08-27: qty 1

## 2020-08-27 MED ORDER — SODIUM CHLORIDE 0.9% FLUSH: 3.0000 mL | INTRAVENOUS | Status: DC | PRN

## 2020-08-27 MED ORDER — ONDANSETRON HCL 4 MG/2ML IJ SOLN
INTRAMUSCULAR | Status: AC
  Filled 2020-08-27: qty 2

## 2020-08-27 MED ORDER — SODIUM CHLORIDE 0.9% FLUSH
3.0000 mL | Freq: Two times a day (BID) | INTRAVENOUS | Status: DC
  Administered 2020-08-27: 3 mL via INTRAVENOUS

## 2020-08-27 MED ORDER — GABAPENTIN 300 MG PO CAPS: 300.0000 mg | ORAL_CAPSULE | ORAL | Status: AC

## 2020-08-27 MED ORDER — LIDOCAINE 2% (20 MG/ML) 5 ML SYRINGE
INTRAMUSCULAR | Status: DC | PRN
  Administered 2020-08-27: 40 mg via INTRAVENOUS

## 2020-08-27 MED ORDER — SUGAMMADEX SODIUM 500 MG/5ML IV SOLN
INTRAVENOUS | Status: AC
  Filled 2020-08-27: qty 5

## 2020-08-27 MED ORDER — ENSURE PRE-SURGERY PO LIQD
296.0000 mL | Freq: Once | ORAL | Status: DC
  Filled 2020-08-27: qty 296

## 2020-08-27 MED ORDER — KETOROLAC TROMETHAMINE 30 MG/ML IJ SOLN
INTRAMUSCULAR | Status: AC
  Filled 2020-08-27: qty 1

## 2020-08-27 MED ORDER — BACITRACIN ZINC 500 UNIT/GM EX OINT
TOPICAL_OINTMENT | CUTANEOUS | Status: AC
  Filled 2020-08-27: qty 28.35

## 2020-08-27 MED ORDER — DIPHENHYDRAMINE HCL 50 MG/ML IJ SOLN
12.5000 mg | Freq: Four times a day (QID) | INTRAMUSCULAR | Status: DC | PRN
Start: 2020-08-27 — End: 2020-08-28

## 2020-08-27 MED ORDER — ROCURONIUM BROMIDE 10 MG/ML (PF) SYRINGE
PREFILLED_SYRINGE | INTRAVENOUS | Status: AC
  Filled 2020-08-27: qty 10

## 2020-08-27 MED ORDER — PHENOL 1.4 % MT LIQD
2.0000 | OROMUCOSAL | Status: DC | PRN
  Filled 2020-08-27: qty 177

## 2020-08-27 MED ORDER — LACTATED RINGERS IR SOLN
Status: DC | PRN
  Administered 2020-08-27: 1000 mL

## 2020-08-27 MED ORDER — MIDAZOLAM HCL 2 MG/2ML IJ SOLN: 0.5000 mg | Freq: Once | INTRAMUSCULAR | Status: DC | PRN

## 2020-08-27 MED ORDER — ACETAMINOPHEN 500 MG PO TABS
1000.0000 mg | ORAL_TABLET | Freq: Four times a day (QID) | ORAL | Status: DC
  Administered 2020-08-27 – 2020-08-28 (×4): 1000 mg via ORAL
  Filled 2020-08-27 (×4): qty 2

## 2020-08-27 MED ORDER — METHOCARBAMOL 1000 MG/10ML IJ SOLN
1000.0000 mg | Freq: Four times a day (QID) | INTRAVENOUS | Status: DC | PRN
  Filled 2020-08-27: qty 10

## 2020-08-27 MED ORDER — EPHEDRINE SULFATE-NACL 50-0.9 MG/10ML-% IV SOSY
PREFILLED_SYRINGE | INTRAVENOUS | Status: DC | PRN
  Administered 2020-08-27: 10 mg via INTRAVENOUS

## 2020-08-27 MED ORDER — BISACODYL 10 MG RE SUPP: 10.0000 mg | Freq: Two times a day (BID) | RECTAL | Status: DC | PRN

## 2020-08-27 MED ORDER — OXYCODONE HCL 5 MG/5ML PO SOLN
5.0000 mg | Freq: Once | ORAL | Status: DC | PRN
Start: 2020-08-27 — End: 2020-08-27

## 2020-08-27 MED ORDER — FENTANYL CITRATE (PF) 100 MCG/2ML IJ SOLN
INTRAMUSCULAR | Status: AC
  Filled 2020-08-27: qty 2

## 2020-08-27 MED ORDER — PROPOFOL 500 MG/50ML IV EMUL
INTRAVENOUS | Status: AC
  Filled 2020-08-27: qty 50

## 2020-08-27 MED ORDER — LIRAGLUTIDE 18 MG/3ML ~~LOC~~ SOPN: 0.6000 mg | PEN_INJECTOR | Freq: Every day | SUBCUTANEOUS | Status: DC

## 2020-08-27 MED ORDER — ACETAMINOPHEN 500 MG PO TABS: 1000.0000 mg | ORAL_TABLET | ORAL | Status: AC

## 2020-08-27 MED ORDER — DEXAMETHASONE SODIUM PHOSPHATE 10 MG/ML IJ SOLN
INTRAMUSCULAR | Status: AC
  Filled 2020-08-27: qty 1

## 2020-08-27 MED ORDER — OXYCODONE HCL 5 MG/5ML PO SOLN: 5.0000 mg | ORAL | Status: DC | PRN

## 2020-08-27 MED ORDER — BUPIVACAINE-EPINEPHRINE 0.25% -1:200000 IJ SOLN
INTRAMUSCULAR | Status: DC | PRN
  Administered 2020-08-27: 50 mL

## 2020-08-27 MED ORDER — BUPIVACAINE-EPINEPHRINE 0.25% -1:200000 IJ SOLN
INTRAMUSCULAR | Status: AC
  Filled 2020-08-27: qty 1

## 2020-08-27 MED ORDER — BUPIVACAINE LIPOSOME 1.3 % IJ SUSP
20.0000 mL | Freq: Once | INTRAMUSCULAR | Status: DC
  Filled 2020-08-27: qty 20

## 2020-08-27 MED ORDER — LIDOCAINE 2% (20 MG/ML) 5 ML SYRINGE
INTRAMUSCULAR | Status: AC
  Filled 2020-08-27: qty 5

## 2020-08-27 MED ORDER — SODIUM CHLORIDE 0.9 % IV SOLN
8.0000 mg | Freq: Four times a day (QID) | INTRAVENOUS | Status: DC | PRN
  Filled 2020-08-27: qty 4

## 2020-08-27 MED ORDER — METOPROLOL TARTRATE 5 MG/5ML IV SOLN: 5.0000 mg | Freq: Four times a day (QID) | INTRAVENOUS | Status: DC | PRN

## 2020-08-27 MED ORDER — PSYLLIUM 95 % PO PACK
1.0000 | PACK | Freq: Two times a day (BID) | ORAL | Status: DC
  Administered 2020-08-27 – 2020-08-28 (×2): 1 via ORAL
  Filled 2020-08-27 (×2): qty 1

## 2020-08-27 MED ORDER — PROMETHAZINE HCL 25 MG/ML IJ SOLN: 6.2500 mg | INTRAMUSCULAR | Status: DC | PRN

## 2020-08-27 MED ORDER — ONDANSETRON HCL 4 MG/2ML IJ SOLN: 4.0000 mg | Freq: Four times a day (QID) | INTRAMUSCULAR | Status: DC | PRN

## 2020-08-27 MED ORDER — METRONIDAZOLE 500 MG/100ML IV SOLN
500.0000 mg | Freq: Once | INTRAVENOUS | Status: DC
  Filled 2020-08-27 (×2): qty 100

## 2020-08-27 MED ORDER — OXYCODONE HCL 5 MG PO TABS: 5.0000 mg | ORAL_TABLET | Freq: Once | ORAL | Status: DC | PRN

## 2020-08-27 MED ORDER — TRAMADOL HCL 50 MG PO TABS
50.0000 mg | ORAL_TABLET | Freq: Four times a day (QID) | ORAL | Status: DC | PRN
  Administered 2020-08-27: 50 mg via ORAL
  Filled 2020-08-27: qty 1

## 2020-08-27 MED ORDER — HYDROMORPHONE HCL 1 MG/ML IJ SOLN: 0.5000 mg | INTRAMUSCULAR | Status: DC | PRN

## 2020-08-27 MED ORDER — SUCCINYLCHOLINE CHLORIDE 200 MG/10ML IV SOSY
PREFILLED_SYRINGE | INTRAVENOUS | Status: DC | PRN
  Administered 2020-08-27: 200 mg via INTRAVENOUS

## 2020-08-27 MED ORDER — CHLORHEXIDINE GLUCONATE CLOTH 2 % EX PADS: 6.0000 | MEDICATED_PAD | Freq: Once | CUTANEOUS | Status: DC

## 2020-08-27 MED ORDER — FENTANYL CITRATE (PF) 100 MCG/2ML IJ SOLN
INTRAMUSCULAR | Status: DC | PRN
  Administered 2020-08-27: 50 ug via INTRAVENOUS
  Administered 2020-08-27: 100 ug via INTRAVENOUS
  Administered 2020-08-27 (×3): 50 ug via INTRAVENOUS

## 2020-08-27 MED ORDER — ALUM & MAG HYDROXIDE-SIMETH 200-200-20 MG/5ML PO SUSP: 30.0000 mL | Freq: Four times a day (QID) | ORAL | Status: DC | PRN

## 2020-08-27 MED ORDER — DEXAMETHASONE SODIUM PHOSPHATE 4 MG/ML IJ SOLN
INTRAMUSCULAR | Status: DC | PRN
  Administered 2020-08-27: 5 mg via INTRAVENOUS

## 2020-08-27 MED ORDER — MAGIC MOUTHWASH
15.0000 mL | Freq: Four times a day (QID) | ORAL | Status: DC | PRN
  Filled 2020-08-27: qty 15

## 2020-08-27 MED ORDER — LACTATED RINGERS IV BOLUS: 1000.0000 mL | Freq: Three times a day (TID) | INTRAVENOUS | Status: DC | PRN

## 2020-08-27 MED ORDER — EPHEDRINE 5 MG/ML INJ
INTRAVENOUS | Status: AC
  Filled 2020-08-27: qty 10

## 2020-08-27 MED ORDER — BUPIVACAINE LIPOSOME 1.3 % IJ SUSP
INTRAMUSCULAR | Status: DC | PRN
  Administered 2020-08-27: 20 mL

## 2020-08-27 MED ORDER — ROCURONIUM BROMIDE 10 MG/ML (PF) SYRINGE
PREFILLED_SYRINGE | INTRAVENOUS | Status: DC | PRN
  Administered 2020-08-27: 20 mg via INTRAVENOUS
  Administered 2020-08-27: 60 mg via INTRAVENOUS
  Administered 2020-08-27: 20 mg via INTRAVENOUS

## 2020-08-27 MED ORDER — ENSURE PRE-SURGERY PO LIQD: 296.0000 mL | Freq: Once | ORAL | Status: DC

## 2020-08-27 MED ORDER — PROPOFOL 10 MG/ML IV BOLUS
INTRAVENOUS | Status: DC | PRN
  Administered 2020-08-27: 200 mg via INTRAVENOUS

## 2020-08-27 MED ORDER — HYDROMORPHONE HCL 2 MG/ML IJ SOLN
INTRAMUSCULAR | Status: AC
  Filled 2020-08-27: qty 1

## 2020-08-27 MED ORDER — ROPINIROLE HCL 0.25 MG PO TABS
0.2500 mg | ORAL_TABLET | Freq: Every day | ORAL | Status: DC
  Administered 2020-08-27: 0.25 mg via ORAL
  Filled 2020-08-27: qty 1

## 2020-08-27 SURGICAL SUPPLY — 40 items
APPLIER CLIP 5 13 M/L LIGAMAX5 (MISCELLANEOUS)
BINDER ABDOMINAL 12 ML 46-62 (SOFTGOODS) IMPLANT
CABLE HIGH FREQUENCY MONO STRZ (ELECTRODE) ×2 IMPLANT
CHLORAPREP W/TINT 26 (MISCELLANEOUS) ×2 IMPLANT
CLIP APPLIE 5 13 M/L LIGAMAX5 (MISCELLANEOUS) IMPLANT
COVER SURGICAL LIGHT HANDLE (MISCELLANEOUS) ×2 IMPLANT
COVER WAND RF STERILE (DRAPES) ×2 IMPLANT
DECANTER SPIKE VIAL GLASS SM (MISCELLANEOUS) ×2 IMPLANT
DEVICE SECURE STRAP 25 ABSORB (INSTRUMENTS) ×2 IMPLANT
DEVICE TROCAR PUNCTURE CLOSURE (ENDOMECHANICALS) ×2 IMPLANT
DRAPE WARM FLUID 44X44 (DRAPES) ×2 IMPLANT
DRSG TEGADERM 2-3/8X2-3/4 SM (GAUZE/BANDAGES/DRESSINGS) ×4 IMPLANT
DRSG TEGADERM 4X4.75 (GAUZE/BANDAGES/DRESSINGS) ×2 IMPLANT
ELECT REM PT RETURN 15FT ADLT (MISCELLANEOUS) ×2 IMPLANT
GAUZE SPONGE 2X2 8PLY STRL LF (GAUZE/BANDAGES/DRESSINGS) ×2 IMPLANT
GLOVE SURG LTX SZ8 (GLOVE) ×2 IMPLANT
GLOVE SURG UNDER LTX SZ8 (GLOVE) ×2 IMPLANT
GOWN STRL REUS W/TWL XL LVL3 (GOWN DISPOSABLE) ×4 IMPLANT
IRRIG SUCT STRYKERFLOW 2 WTIP (MISCELLANEOUS)
IRRIGATION SUCT STRKRFLW 2 WTP (MISCELLANEOUS) IMPLANT
KIT BASIN OR (CUSTOM PROCEDURE TRAY) ×2 IMPLANT
KIT TURNOVER KIT A (KITS) ×2 IMPLANT
MARKER SKIN DUAL TIP RULER LAB (MISCELLANEOUS) ×2 IMPLANT
MESH VENTRALIGHT ST 8X10 (Mesh General) ×2 IMPLANT
NEEDLE SPNL 22GX3.5 QUINCKE BK (NEEDLE) IMPLANT
PAD POSITIONING PINK XL (MISCELLANEOUS) ×2 IMPLANT
PENCIL SMOKE EVACUATOR (MISCELLANEOUS) IMPLANT
SCISSORS LAP 5X35 DISP (ENDOMECHANICALS) ×2 IMPLANT
SET TUBE SMOKE EVAC HIGH FLOW (TUBING) ×2 IMPLANT
SLEEVE ADV FIXATION 5X100MM (TROCAR) ×2 IMPLANT
SPONGE GAUZE 2X2 STER 10/PKG (GAUZE/BANDAGES/DRESSINGS) ×2
STRIP CLOSURE SKIN 1/2X4 (GAUZE/BANDAGES/DRESSINGS) ×4 IMPLANT
SUT MNCRL AB 4-0 PS2 18 (SUTURE) ×2 IMPLANT
SUT PDS AB 1 CT1 27 (SUTURE) ×6 IMPLANT
SUT PROLENE 1 CT 1 30 (SUTURE) ×10 IMPLANT
TOWEL OR 17X26 10 PK STRL BLUE (TOWEL DISPOSABLE) ×2 IMPLANT
TRAY LAPAROSCOPIC (CUSTOM PROCEDURE TRAY) ×2 IMPLANT
TROCAR ADV FIXATION 11X100MM (TROCAR) IMPLANT
TROCAR ADV FIXATION 5X100MM (TROCAR) ×2 IMPLANT
TROCAR BLADELESS OPT 5 100 (ENDOMECHANICALS) ×2 IMPLANT

## 2020-08-27 NOTE — Op Note (Signed)
08/27/2020  PATIENT:  Gregory Castillo  40 y.o. male  Patient Care Team: Lewis Moccasin, MD as PCP - General (Family Medicine)  PRE-OPERATIVE DIAGNOSIS:  RECURRENT INCARCERATED INCISIONAL HERNIA  POST-OPERATIVE DIAGNOSIS:   RECURRENT INCARCERATED INCISIONAL HERNIA WITH SMALL BOWEL OBSTRUCTION  PROCEDURE:   LAPAROSCOPIC REPAIR OF  INCISIONAL RECURRENT INCARCERATED ABDOMINAL WALL HERNIA WITH MESH  DIAGNOSTIC LAPAROSCOPY  TAP BLOCK - BILATERAL  SURGEON:  Ardeth Sportsman, MD  ASSISTANT: Nurse   ANESTHESIA:     General  Nerve block provided with liposomal bupivacaine (Experel) mixed with 0.25% bupivacaine as a Bilateral TAP block x 73mL each side at the level of the transverse abdominis & preperitoneal spaces along the flank at the anterior axillary line, from subcostal ridge to iliac crest under laparoscopic guidance   EBL:  Total I/O In: 1700 [I.V.:1500; IV Piggyback:200] Out: 10 [Blood:10]  Per anesthesia record  Delay start of Pharmacological VTE agent (>24hrs) due to surgical blood loss or risk of bleeding:  no  DRAINS: none   SPECIMEN:  No Specimen  DISPOSITION OF SPECIMEN:  N/A  COUNTS:  YES  PLAN OF CARE: Admit to inpatient   PATIENT DISPOSITION:  PACU - hemodynamically stable.  INDICATION: Obese smoking male with history of hernia repair when he lived up in [place] about a year ago.  She developed recurrence.  Struggle with constipation and other issues.  Had worsening pain and swelling.  Found to have hernia.  CAT scan showing small bowel within it.  Too painful to reduce.  We recommended urgent laparoscopic possible open exploration and hernia repair.    The anatomy & physiology of the abdominal wall was discussed. The pathophysiology of hernias was discussed. Natural history risks without surgery including progeressive enlargement, pain, incarceration & strangulation was discussed. Contributors to complications such as smoking, obesity, diabetes, prior  surgery, etc were discussed.  I feel the risks of no intervention will lead to serious problems that outweigh the operative risks; therefore, I recommended surgery to reduce and repair the hernia. I explained laparoscopic techniques with possible need for an open approach. I noted the probable use of mesh to patch and/or buttress the hernia repair.  Risks such as bleeding, infection, abscess, need for further treatment, heart attack, death, and other risks were discussed. I noted a good likelihood this will help address the problem. Goals of post-operative recovery were discussed as well. Possibility that this will not correct all symptoms was explained. I stressed the importance of low-impact activity, aggressive pain control, avoiding constipation, & not pushing through pain to minimize risk of post-operative chronic pain or injury. Possibility of reherniation especially with smoking, obesity, diabetes, immunosuppression, and other health conditions was discussed. We will work to minimize complications.   An educational handout further explaining the pathology & treatment options was given as well. Questions were answered. The patient expresses understanding & wishes to proceed with surgery.   OR FINDINGS:   Type of repair: Laparoscopic underlay repair.  Primary repair of largest hernia   Placement of mesh: Centrally intraperitoneal with edges tucked into RECTRORECTUS & preperitoneal space  Name of mesh: Bard Ventralight dual sided (polypropylene / Seprafilm)  Size of mesh: 25x20cm  Orientation: Transverse  Mesh overlap:  5-7cm   DESCRIPTION:   Informed consent was confirmed. The patient underwent general anaesthesia without difficulty. The patient was positioned appropriately. VTE prevention in place. The patient's abdomen was clipped, prepped, & draped in a sterile fashion. Surgical timeout confirmed our plan.  The  patient was positioned in reverse Trendelenburg. Abdominal entry was  gained using optical entry technique in the left upper abdomen. Entry was clean. I induced carbon dioxide insufflation. Camera inspection revealed no injury. Extra ports were carefully placed under direct laparoscopic visualization.   I could see adhesions on the parietal peritoneum under the abdominal wall.  I was gradually able to massage the masses incarcerated in the subcutaneous tissues down.  This help reduce and release a loop of small bowel that was somewhat folded upon itself.  It almost looks like he had a prior small bowel resection and small bowel anastomosis.  There was some edema and cicatrix consistent with a early obstruction and transition point but there is no evidence of any ischemia or necrosis gangrene or perforation.  I did laparoscopic lysis of adhesions to expose the entire anterior abdominal wall.  I primarily used focused sharp dissection.  I did diagnostic laparoscopy.  I found the ileocecal valve around the small bowel proximally to the ligament of Treitz.  There were no interloop adhesions or other concerns except for the previously explained short segment of small bowel.  It was edematous.  I held off on doing more aggressive dissection.  So no other surprises or other concerns.  There is no evidence of purulence.  Dr. Gerrit Friends was able to come in and confirmed he so no any concerns of infection or perforation.  Based on that, we felt it would be reasonable to go him proceed with hernia repair and use mesh given the recurrence and his obesity.  I freed off the falciform ligament and central peritoneum to expose the retrorectus fascia   I made sure hemostasis was good.  I mapped out the region using a needle passer.   To ensure that I would have at least 5 cm radial coverage outside of the hernia defect, I chose a 25x20cm dual sided mesh.  I placed #1 Prolene stitches around its edge about every 5 cm = 12 total.  I rolled the mesh & placed into the peritoneal cavity through the  hernia  defect.  I unrolled the mesh and positioned it appropriately.  I secured the mesh to cover up the hernia defect using a laparoscopic suture passer to pass the tails of the Prolene through the abdominal wall & tagged them with clamps for good transfascial suturing.  I started out in four corners to make sure I had the mesh centered under the hernia defect appropriately, and then proceeded to work in quadrants.    We evacuated CO2 & desufflated the abdomen.  I tied the fascial stitches down.  I excised out redundant hernia sac until had healthy subcutaneous tissues.  Excised old scar as well that was thinned out.  I closed the fascial defect that I placed the mesh through using #1 PDS interrupted transverse stitches in a running fashion primarily.  I reinsufflated the abdomen. The mesh provided at least circumferential coverage around the entire region of hernia defect   I secured the mesh centrally with an additional trans fascial stitch in & out the mesh using #1 PDS under laparoscopic visualization.   I tacked the edges & central part of the mesh to the peritoneum/posterior rectus fascia with SecureStrap absorbable tacks.   I did reinspection. Hemostasis was good. Mesh laid well. I completed a broad field block of local anesthesia at fascial stitch sites & fascial closure areas.    Capnoperitoneum was evacuated. Ports were removed. The skin was closed with Monocryl at  the port sites and Steri-Strips on the fascial stitch puncture sites.  Patient is being extubated to go to the recovery room.  I discussed operative findings, updated the patient's status, discussed probable steps to recovery, and gave postoperative recommendations to the patient's spouse, Kule Gascoigne.  Recommendations were made.  Questions were answered.  She expressed understanding & appreciation.  Ardeth Sportsman, M.D., F.A.C.S. Gastrointestinal and Minimally Invasive Surgery [facility] [address], [address] [place], [place] [address] [phone] Main / Paging  08/27/2020 11:17 AM

## 2020-08-27 NOTE — Anesthesia Procedure Notes (Signed)
Procedure Name: Intubation Date/Time: 08/27/2020 8:53 AM Performed by: Vanessa [place], CRNA Pre-anesthesia Checklist: Patient identified, Emergency Drugs available, Suction available and Patient being monitored Patient Re-evaluated:Patient Re-evaluated prior to induction Oxygen Delivery Method: Circle system utilized Preoxygenation: Pre-oxygenation with 100% oxygen Induction Type: IV induction, Rapid sequence and Cricoid Pressure applied Laryngoscope Size: 2 and Miller Grade View: Grade I Tube type: Oral Tube size: 7.5 mm Number of attempts: 1 Airway Equipment and Method: Stylet Placement Confirmation: ETT inserted through vocal cords under direct vision,  positive ETCO2 and breath sounds checked- equal and bilateral Secured at: 22 cm Tube secured with: Tape Dental Injury: Teeth and Oropharynx as per pre-operative assessment

## 2020-08-27 NOTE — Anesthesia Preprocedure Evaluation (Addendum)
Anesthesia Evaluation  Patient identified by MRN, date of birth, ID band Patient awake    Reviewed: Allergy & Precautions, NPO status , Patient's Chart, lab work & pertinent test results, reviewed documented beta blocker date and time   History of Anesthesia Complications Negative for: history of anesthetic complications  Airway Mallampati: II  TM Distance: >3 FB Neck ROM: Full    Dental  (+) Chipped, Dental Advisory Given   Pulmonary Current Smoker and Patient abstained from smoking.,  08/26/2020 SARS coronavirus NEG   breath sounds clear to auscultation       Cardiovascular hypertension, Pt. on medications and Pt. on home beta blockers (-) angina Rhythm:Regular Rate:Normal     Neuro/Psych Depression negative neurological ROS     GI/Hepatic Neg liver ROS, GERD  Medicated and Controlled,Incarcerated inguinal hernia with SBO   Endo/Other  diabetes (glu 117), Oral Hypoglycemic AgentsMorbid obesity  Renal/GU negative Renal ROS     Musculoskeletal   Abdominal (+) + obese,   Peds  Hematology negative hematology ROS (+)   Anesthesia Other Findings   Reproductive/Obstetrics                            Anesthesia Physical Anesthesia Plan  ASA: III and emergent  Anesthesia Plan: General   Post-op Pain Management:    Induction: Intravenous and Rapid sequence  PONV Risk Score and Plan: 1 and Ondansetron and Dexamethasone  Airway Management Planned: Oral ETT  Additional Equipment: None  Intra-op Plan:   Post-operative Plan: Extubation in OR  Informed Consent: I have reviewed the patients History and Physical, chart, labs and discussed the procedure including the risks, benefits and alternatives for the proposed anesthesia with the patient or authorized representative who has indicated his/her understanding and acceptance.     Dental advisory given  Plan Discussed with: CRNA and  Surgeon  Anesthesia Plan Comments:        Anesthesia Quick Evaluation

## 2020-08-27 NOTE — Transfer of Care (Signed)
Immediate Anesthesia Transfer of Care Note  Patient: Gregory Castillo  Procedure(s) Performed: LAPAROSCOPIC INCISIONAL HERNIA  REDUCTION AND REPAIR WITH MESH; TAP BLOCK (N/A Abdomen)  Patient Location: PACU  Anesthesia Type:General  Level of Consciousness: awake, alert , oriented and patient cooperative  Airway & Oxygen Therapy: Patient Spontanous Breathing and Patient connected to face mask  Post-op Assessment: Report given to RN and Post -op Vital signs reviewed and stable  Post vital signs: Reviewed and stable  Last Vitals:  Vitals Value Taken Time  BP 129/61 08/27/20 1113  Temp    Pulse 101 08/27/20 1113  Resp 28 08/27/20 1113  SpO2 100 % 08/27/20 1113  Vitals shown include unvalidated device data.  Last Pain:  Vitals:   08/27/20 0800  TempSrc:   PainSc: 2          Complications: No complications documented.

## 2020-08-27 NOTE — Interval H&P Note (Signed)
History and Physical Interval Note:  08/27/2020 7:48 AM  Gregory Castillo  has presented today for surgery, with the diagnosis of RECURRENT INCARCERATED INCISIONAL HERNIA.    Films reviewed with Dr. Gerrit Friends.  Patient reexamined.  Still needing strong pain medications.  Attempt made to treat and reduce again but still extremely painful.  CT scan shows small bowel incarcerated in it.  Given concerns of severe pain with small bowel.  Risk of strangulation concerning.  Recommended laparoscopic possible open exploration with reduction of hernia and repair.  If there is no evidence of any infection or necrosis, try and do more definitive mesh repair given his obesity.  If evidence of infection/necrosis/need for small bowel resection, primary repair only understanding risk of recurrence is higher but minimizes risk of mesh infection.  The anatomy & physiology of the abdominal wall and pelvic floor was discussed.  The pathophysiology of hernias in the inguinal and pelvic region was discussed.  Natural history risks such as progressive enlargement, pain, incarceration, and strangulation was discussed.   Contributors to complications such as smoking, obesity, diabetes, prior surgery, etc were discussed.    I feel the risks of no intervention will lead to serious problems that outweigh the operative risks; therefore, I recommended surgery to reduce and repair the hernia.  I explained laparoscopic techniques with possible need for an open approach.  I noted usual use of mesh to patch and/or buttress hernia repair  Risks such as bleeding, infection, abscess, need for further treatment, injury to other organs, need for repair of tissues / organs, stroke, heart attack, death, and other risks were discussed.  Need for possible bowel resection and emergent resetting and higher risk of recurrence noted as well I noted a good likelihood this will help address the problem.   Goals of post-operative recovery were discussed  as well.  Possibility that this will not correct all symptoms was explained.  I stressed the importance of low-impact activity, aggressive pain control, avoiding constipation, & not pushing through pain to minimize risk of post-operative chronic pain or injury. Possibility of reherniation was discussed.  We will work to minimize complications.     An educational handout further explaining the pathology & treatment options was given as well.  Questions were answered.  The patient expresses understanding & wishes to proceed with surgery.  Patient also a smoker and morbidly obese.  I started recommend he quit smoking and try and lose weight to minimize wound/mesh infection & hernia recurrence  STOP SMOKING! We talked to the patient about the dangers of smoking.  We stressed that tobacco use dramatically increases the risk of peri-operative complications such as infection, tissue necrosis leaving to problems with incision/wound and organ healing, hernia, chronic pain, heart attack, stroke, DVT, pulmonary embolism, and death.  We noted there are programs in our community to help stop smoking.  Information was available.     The various methods of treatment have been discussed with the patient and family. After consideration of risks, benefits and other options for treatment, the patient has consented to  Procedure(s): LAPAROSCOPIC VENTRAL HERNIA REPAIR WITH MESH POSSIBLE OPEN (N/A) as a surgical intervention.  The patient's history has been reviewed, patient examined, no change in status, stable for surgery.  I have reviewed the patient's chart and labs.  Questions were answered to the patient's satisfaction.    I have re-reviewed the the patient's records, history, medications, and allergies.  I have re-examined the patient.  I again discussed intraoperative plans  and goals of post-operative recovery.  The patient agrees to proceed.  Gregory Castillo [facility]  Aug 22, 1980 [id number]  Patient Care Team: Lewis Moccasin, MD as PCP - General (Family Medicine)  Patient Active Problem List   Diagnosis Date Noted   Incarcerated incisional hernia 08/26/2020    Past Medical History:  Diagnosis Date   Diabetes mellitus without complication (HCC)    Hypertension    Umbilical hernia     Past Surgical History:  Procedure Laterality Date   HERNIA REPAIR      Social History   Socioeconomic History   Marital status: Married    Spouse name: Not on file   Number of children: Not on file   Years of education: Not on file   Highest education level: Not on file  Occupational History   Not on file  Tobacco Use   Smoking status: Current Every Day Smoker    Packs/day: 1.00    Types: Cigarettes   Smokeless tobacco: Never Used  Vaping Use   Vaping Use: Never used  Substance and Sexual Activity   Alcohol use: Never   Drug use: Never   Sexual activity: Not on file  Other Topics Concern   Not on file  Social History Narrative   Not on file   Social Determinants of Health   Financial Resource Strain: Not on file  Food Insecurity: Not on file  Transportation Needs: Not on file  Physical Activity: Not on file  Stress: Not on file  Social Connections: Not on file  Intimate Partner Violence: Not on file    Family History  Family history unknown: Yes    Medications Prior to Admission  Medication Sig Dispense Refill Last Dose   amitriptyline (ELAVIL) 10 MG tablet Take 10 mg by mouth at bedtime.   08/25/2020 at Unknown time   atorvastatin (LIPITOR) 10 MG tablet Take 1 tablet by mouth at bedtime.   08/25/2020 at Unknown time   cholecalciferol (VITAMIN D3) 25 MCG (1000 UNIT) tablet Take 2,000 Units by mouth at bedtime.   08/25/2020 at Unknown time   lisinopril-hydrochlorothiazide (ZESTORETIC) 20-12.5 MG tablet Take 1 tablet by mouth at bedtime.   08/25/2020 at Unknown time   metFORMIN (GLUCOPHAGE) 500 MG tablet Take 1 tablet by mouth every evening.   08/25/2020 at Unknown time   metoprolol  succinate (TOPROL-XL) 50 MG 24 hr tablet Take 50 mg by mouth every evening.   08/25/2020 at 2030   omeprazole (PRILOSEC) 40 MG capsule Take 1 capsule by mouth every evening.   08/25/2020 at Unknown time   rOPINIRole (REQUIP) 0.25 MG tablet Take 0.25 mg by mouth at bedtime.   Past Month at Unknown time   VICTOZA 33 MG/3ML SOPN Inject 0.6 mg into the skin daily.   08/25/2020 at Unknown time    Current Facility-Administered Medications  Medication Dose Route Frequency Provider Last Rate Last Admin   0.45 % NaCl with KCl 20 mEq / L infusion   Intravenous Continuous Darnell Level, MD 100 mL/hr at 08/26/20 2331 New Bag at 08/26/20 2331   acetaminophen (TYLENOL) tablet 650 mg  650 mg Oral Q6H PRN Darnell Level, MD       Or   acetaminophen (TYLENOL) suppository 650 mg  650 mg Rectal Q6H PRN Darnell Level, MD       acetaminophen (TYLENOL) tablet 1,000 mg  1,000 mg Oral On Call to OR Karie Soda, MD       amitriptyline (ELAVIL) tablet 10 mg  10  mg Oral QHS Darnell Level, MD   10 mg at 08/26/20 2325   bupivacaine liposome (EXPAREL) 1.3 % injection 266 mg  20 mL Infiltration Once Karie Soda, MD       ceFAZolin (ANCEF) IVPB 3g/100 mL premix  3 g Intravenous On Call to OR Karie Soda, MD       celecoxib (CELEBREX) capsule 200 mg  200 mg Oral On Call to OR Karie Soda, MD       Chlorhexidine Gluconate Cloth 2 % PADS 6 each  6 each Topical Once Karie Soda, MD       And   Chlorhexidine Gluconate Cloth 2 % PADS 6 each  6 each Topical Once Karie Soda, MD       feeding supplement (ENSURE PRE-SURGERY) liquid 296 mL  296 mL Oral Once Karie Soda, MD       feeding supplement (ENSURE PRE-SURGERY) liquid 592 mL  592 mL Oral Once Karie Soda, MD       gabapentin (NEURONTIN) capsule 300 mg  300 mg Oral On Call to OR Karie Soda, MD       lisinopril (ZESTRIL) tablet 20 mg  20 mg Oral QHS Darnell Level, MD   20 mg at 08/26/20 2325   And   hydrochlorothiazide (MICROZIDE) capsule 12.5 mg  12.5 mg Oral QHS  Darnell Level, MD   12.5 mg at 08/26/20 2325   HYDROmorphone (DILAUDID) injection 1 mg  1 mg Intravenous Q2H PRN Darnell Level, MD   1 mg at 08/27/20 0534   insulin aspart (novoLOG) injection 0-15 Units  0-15 Units Subcutaneous Q4H Darnell Level, MD       metFORMIN (GLUCOPHAGE) tablet 500 mg  500 mg Oral Q supper Darnell Level, MD       metoprolol succinate (TOPROL-XL) 24 hr tablet 50 mg  50 mg Oral QPM Darnell Level, MD   50 mg at 08/26/20 2325   ondansetron (ZOFRAN-ODT) disintegrating tablet 4 mg  4 mg Oral Q6H PRN Darnell Level, MD       Or   ondansetron [facility]) injection 4 mg  4 mg Intravenous Q6H PRN Darnell Level, MD   4 mg at 08/26/20 2256   oxyCODONE (Oxy IR/ROXICODONE) immediate release tablet 5-10 mg  5-10 mg Oral Q4H PRN Darnell Level, MD       pantoprazole (PROTONIX) injection 40 mg  40 mg Intravenous Q24H Darnell Level, MD   40 mg at 08/26/20 2329     No Known Allergies  BP 122/69 (BP Location: Right Arm)   Pulse 88   Temp 98.7 F (37.1 C) (Oral)   Resp 18   Ht 5\' 11"  (1.803 m)   Wt 123.5 kg   SpO2 95%   BMI 37.96 kg/m   Labs: Results for orders placed or performed during the hospital encounter of 08/26/20 (from the past 48 hour(s))  Urinalysis, Routine w reflex microscopic Urine, Clean Catch     Status: Abnormal   Collection Time: 08/26/20  4:08 PM  Result Value Ref Range   Color, Urine YELLOW YELLOW   APPearance CLEAR CLEAR   Specific Gravity, Urine 1.026 1.005 - 1.030   pH 6.0 5.0 - 8.0   Glucose, UA NEGATIVE NEGATIVE mg/dL   Hgb urine dipstick NEGATIVE NEGATIVE   Bilirubin Urine NEGATIVE NEGATIVE   Ketones, ur 20 (A) NEGATIVE mg/dL   Protein, ur 30 (A) NEGATIVE mg/dL   Nitrite NEGATIVE NEGATIVE   Leukocytes,Ua NEGATIVE NEGATIVE   RBC / HPF 0-5 0 - 5  RBC/hpf   WBC, UA 0-5 0 - 5 WBC/hpf   Bacteria, UA NONE SEEN NONE SEEN   Mucus PRESENT    Hyaline Casts, UA PRESENT     Comment: Performed at [facility], [address] [place], [place]  [address]  Lipase, blood     Status: None   Collection Time: 08/26/20  4:16 PM  Result Value Ref Range   Lipase 51 11 - 51 U/L    Comment: Performed at [facility], [address] [place], [place] [address]  Comprehensive metabolic panel     Status: Abnormal   Collection Time: 08/26/20  4:16 PM  Result Value Ref Range   Sodium 136 135 - 145 mmol/L   Potassium 3.9 3.5 - 5.1 mmol/L   Chloride 100 98 - 111 mmol/L   CO2 25 22 - 32 mmol/L   Glucose, Bld 136 (H) 70 - 99 mg/dL    Comment: Glucose reference range applies only to samples taken after fasting for at least 8 hours.   BUN 15 6 - 20 mg/dL   Creatinine, Ser 0.980.94 0.61 - 1.24 mg/dL   Calcium 9.7 8.9 - 11.910.3 mg/dL   Total Protein 8.0 6.5 - 8.1 g/dL   Albumin 4.1 3.5 - 5.0 g/dL   AST 31 15 - 41 U/L   ALT 56 (H) 0 - 44 U/L   Alkaline Phosphatase 81 38 - 126 U/L   Total Bilirubin 0.4 0.3 - 1.2 mg/dL   GFR, Estimated >14>60 >78>60 mL/min    Comment: (NOTE) Calculated using the CKD-EPI Creatinine Equation (2021)    Anion gap 11 5 - 15    Comment: Performed at [facility], [address] [place], [place] [address]  CBC     Status: Abnormal   Collection Time: 08/26/20  4:16 PM  Result Value Ref Range   WBC 12.3 (H) 4.0 - 10.5 K/uL   RBC 5.35 4.22 - 5.81 MIL/uL   Hemoglobin 14.0 13.0 - 17.0 g/dL   HCT 13.044.2 86.539.0 - 78.452.0 %   MCV 82.6 80.0 - 100.0 fL   MCH 26.2 26.0 - 34.0 pg   MCHC 31.7 30.0 - 36.0 g/dL   RDW 69.614.4 29.511.5 - 28.415.5 %   Platelets 318 150 - 400 K/uL   nRBC 0.0 0.0 - 0.2 %    Comment: Performed at [facility], [address] [place], [place] [address]  Hemoglobin A1c     Status: Abnormal   Collection Time: 08/26/20  4:16 PM  Result Value Ref Range   Hgb A1c MFr Bld 7.5 (H) 4.8 - 5.6 %    Comment: (NOTE) Pre diabetes:          5.7%-6.4%  Diabetes:              >6.4%  Glycemic control for   <7.0% adults with diabetes    Mean Plasma Glucose 168.55 mg/dL    Comment:  Performed at [facility], [address] [place], [place] [address]  CBG monitoring, ED     Status: Abnormal   Collection Time: 08/26/20 11:14 PM  Result Value Ref Range   Glucose-Capillary 114 (H) 70 - 99 mg/dL    Comment: Glucose reference range applies only to samples taken after fasting for at least 8 hours.  Resp Panel by RT-PCR (Flu A&B, Covid)     Status: None   Collection Time: 08/26/20 11:22 PM  Result Value Ref Range  SARS Coronavirus 2 by RT PCR NEGATIVE NEGATIVE    Comment: (NOTE) SARS-CoV-2 target nucleic acids are NOT DETECTED.  The SARS-CoV-2 RNA is generally detectable in upper respiratory specimens during the acute phase of infection. The lowest concentration of SARS-CoV-2 viral copies this assay can detect is 138 copies/mL. A negative result does not preclude SARS-Cov-2 infection and should not be used as the sole basis for treatment or other patient management decisions. A negative result may occur with  improper specimen collection/handling, submission of specimen other than nasopharyngeal swab, presence of viral mutation(s) within the areas targeted by this assay, and inadequate number of viral copies(<138 copies/mL). A negative result must be combined with clinical observations, patient history, and epidemiological information. The expected result is Negative.  Fact Sheet for Patients:  BloggerCourse.com  Fact Sheet for Healthcare Providers:  SeriousBroker.it  This test is no t yet approved or cleared by the [place] FDA and  has been authorized for detection and/or diagnosis of SARS-CoV-2 by FDA under an Emergency Use Authorization (EUA). This EUA will remain  in effect (meaning this test can be used) for the duration of the COVID-19 declaration under Section 564(b)(1) of the Act, 21 U.S.C.section 360bbb-3(b)(1), unless the authorization is terminated  or revoked sooner.       Influenza A  by PCR NEGATIVE NEGATIVE   Influenza B by PCR NEGATIVE NEGATIVE    Comment: (NOTE) The Xpert Xpress SARS-CoV-2/FLU/RSV plus assay is intended as an aid in the diagnosis of influenza from Nasopharyngeal swab specimens and should not be used as a sole basis for treatment. Nasal washings and aspirates are unacceptable for Xpert Xpress SARS-CoV-2/FLU/RSV testing.  Fact Sheet for Patients: BloggerCourse.com  Fact Sheet for Healthcare Providers: SeriousBroker.it  This test is not yet approved or cleared by the [place] FDA and has been authorized for detection and/or diagnosis of SARS-CoV-2 by FDA under an Emergency Use Authorization (EUA). This EUA will remain in effect (meaning this test can be used) for the duration of the COVID-19 declaration under Section 564(b)(1) of the Act, 21 U.S.C. section 360bbb-3(b)(1), unless the authorization is terminated or revoked.  Performed at [facility], [address] [place], [place] [address]   Glucose, capillary     Status: Abnormal   Collection Time: 08/27/20  2:19 AM  Result Value Ref Range   Glucose-Capillary 120 (H) 70 - 99 mg/dL    Comment: Glucose reference range applies only to samples taken after fasting for at least 8 hours.  Glucose, capillary     Status: Abnormal   Collection Time: 08/27/20  7:38 AM  Result Value Ref Range   Glucose-Capillary 117 (H) 70 - 99 mg/dL    Comment: Glucose reference range applies only to samples taken after fasting for at least 8 hours.    Imaging / Studies: CT Abdomen Pelvis Wo Contrast  Result Date: 08/26/2020 CLINICAL DATA:  Periumbilical abdominal pain, constipation. EXAM: CT ABDOMEN AND PELVIS WITHOUT CONTRAST TECHNIQUE: Multidetector CT imaging of the abdomen and pelvis was performed following the standard protocol without IV contrast. COMPARISON:  None. FINDINGS: Lower chest: No acute abnormality. Hepatobiliary: No focal  liver abnormality is seen. No gallstones, gallbladder wall thickening, or biliary dilatation. Pancreas: Unremarkable. No pancreatic ductal dilatation or surrounding inflammatory changes. Spleen: Normal in size without focal abnormality. Adrenals/Urinary Tract: Adrenal glands are unremarkable. Kidneys are normal, without renal calculi, focal lesion, or hydronephrosis. Bladder is unremarkable. Stomach/Bowel: The stomach appears normal. Diverticulosis is noted throughout the colon without  inflammation. Large supraumbilical ventral hernia is noted which contains a loop of small bowel; this results in mild dilatation of the more proximal small bowel loops suggesting some degree of obstruction. Vascular/Lymphatic: No significant vascular findings are present. No enlarged abdominal or pelvic lymph nodes. Reproductive: Prostate is unremarkable. Other: No ascites is noted. Musculoskeletal: No acute or significant osseous findings. IMPRESSION: Large supraumbilical ventral hernia is noted which contains a loop of small bowel, which results in mild dilatation of the more proximal small bowel loop suggesting some degree of obstruction. Diverticulosis is noted throughout the colon without inflammation. Electronically Signed   By: Lupita Raider M.D.   On: 08/26/2020 17:34     .Ardeth Sportsman, M.D., F.A.C.S. Gastrointestinal and Minimally Invasive Surgery [facility] [address] [place], [place] [address] [phone] Main / Paging  08/27/2020 7:51 AM     Ardeth Sportsman

## 2020-08-27 NOTE — Anesthesia Postprocedure Evaluation (Signed)
Anesthesia Post Note  Patient: Gregory Castillo  Procedure(s) Performed: LAPAROSCOPIC INCISIONAL HERNIA  REDUCTION AND REPAIR WITH MESH; TAP BLOCK (N/A Abdomen)     Patient location during evaluation: PACU Anesthesia Type: General Level of consciousness: awake and alert, patient cooperative and oriented Pain management: pain level controlled Vital Signs Assessment: post-procedure vital signs reviewed and stable Respiratory status: spontaneous breathing, nonlabored ventilation and respiratory function stable Cardiovascular status: blood pressure returned to baseline and stable Postop Assessment: no apparent nausea or vomiting Anesthetic complications: no   No complications documented.  Last Vitals:  Vitals:   08/27/20 1145 08/27/20 1204  BP: 130/72 120/72  Pulse: 93 81  Resp: 20 16  Temp: 37.1 C   SpO2: 97% 96%    Last Pain:  Vitals:   08/27/20 1145  TempSrc:   PainSc: 0-No pain                 Jyla Hopf,E. Felicie Kocher

## 2020-08-28 LAB — GLUCOSE, CAPILLARY
Glucose-Capillary: 145 mg/dL — ABNORMAL HIGH (ref 70–99)
Glucose-Capillary: 120 mg/dL — ABNORMAL HIGH (ref 70–99)
Glucose-Capillary: 208 mg/dL — ABNORMAL HIGH (ref 70–99)

## 2020-08-28 MED ORDER — MUPIROCIN 2 % EX OINT
1.0000 "application " | TOPICAL_OINTMENT | Freq: Two times a day (BID) | CUTANEOUS | Status: DC
  Filled 2020-08-28: qty 22

## 2020-08-28 MED ORDER — OXYCODONE HCL 5 MG PO TABS: 5.0000 mg | ORAL_TABLET | ORAL | 0 refills | Status: AC | PRN

## 2020-08-28 NOTE — Discharge Summary (Signed)
Physician Discharge Summary    Patient ID: Renato ShinMichael J Vegh MRN: [id number] DOB/AGE: 1980/05/23  40 y.o.  Patient Care Team: Lewis Moccasinewey, Elizabeth R, MD as PCP - General (Family Medicine) Karie SodaGross, Tawan Corkern, MD as Consulting Physician (General Surgery)  Admit date: 08/26/2020  Discharge date: 08/28/2020  Hospital Stay = 1 days    Discharge Diagnoses:  Principal Problem:   Recurrent Incarcerated incisional hernia with SBO Active Problems:   SBO (small bowel obstruction) (HCC)   Tobacco abuse   Obesity (BMI 30-39.9)   Hypertension   Diabetes mellitus without complication (HCC)   Depression   Hypercholesteremia   1 Day Post-Op  08/27/2020  POST-OPERATIVE DIAGNOSIS:   RECURRENT INCARCERATED INCISIONAL HERNIA  SURGERY:  08/27/2020  Procedure(s): LAPAROSCOPIC INCISIONAL HERNIA  REDUCTION AND REPAIR WITH MESH; TAP BLOCK  SURGEON:    Surgeon(s): Karie SodaGross, Chene Kasinger, MD  Consults: None  Hospital Course:   Active obese smoking gentleman with history of prior [place] in [place] complicated by hernia recurrence.  [facility] [place] [facility]  Had worsening pain.  Felt a hard knot.  Went to emergency room.  Incarcerated hernia with small bowel within it.  Not reducible.  Therefore sent for urgent surgery.  We ultimately reduce and repair laparoscopically.  Because there is no active infection or necrosis/cellulitis I proceeded with more definitive hernia mesh repair.  The patient underwent  the surgery above.  Postoperatively, the patient gradually mobilized and advanced to a solid diet.  Pain and other symptoms were treated aggressively.    By the time of discharge, the patient was walking well the hallways, eating food, having flatus.  Pain was well-controlled on an oral medications.  Based on meeting discharge criteria and continuing to recover, I felt it was safe for the patient to be discharged from the hospital to further recover with close followup. Postoperative  recommendations were discussed in detail.  They are written as well.  Discharged Condition: good  Discharge Exam: Blood pressure 118/61, pulse (!) 53, temperature 98 F (36.7 C), temperature source Oral, resp. rate 15, height 5\' 11"  (1.803 m), weight 123.5 kg, SpO2 98 %.  General: Pt awake/alert/oriented x4 in No acute distress Eyes: PERRL, normal EOM.  Sclera clear.  No icterus Neuro: CN II-XII intact w/o focal sensory/motor deficits. Lymph: No head/neck/groin lymphadenopathy Psych:  No delerium/psychosis/paranoia HENT: Normocephalic, Mucus membranes moist.  No thrush Neck: Supple, No tracheal deviation Chest: No chest wall pain w good excursion CV:  Pulses intact.  Regular rhythm MS: Normal AROM mjr joints.  No obvious deformity Abdomen: Soft.  Nondistended.  Nontender.  No evidence of peritonitis.  No incarcerated hernias. Ext:  SCDs BLE.  No mjr edema.  No cyanosis Skin: No petechiae / purpura   Disposition:    Follow-up Information    [facility] Schedule an appointment as soon as possible for a visit in 3 weeks.   Specialty: General Surgery Why: To follow up after your operation, To follow up after your hospital stay Contact information: [address] [place] [id number] [phone]              Discharge disposition: 01-Home or Self Care       Discharge Instructions    Call MD for:   Complete by: As directed    FEVER > 101.5 F  (temperatures < 101.5 F are not significant)   Call MD for:  extreme fatigue   Complete by: As directed    Call MD  for:  persistant dizziness or light-headedness   Complete by: As directed    Call MD for:  persistant nausea and vomiting   Complete by: As directed    Call MD for:  redness, tenderness, or signs of infection (pain, swelling, redness, odor or green/yellow discharge around incision site)   Complete by: As directed    Call MD for:  severe uncontrolled pain    Complete by: As directed    Diet - low sodium heart healthy   Complete by: As directed    Start with a bland diet such as soups, liquids, starchy foods, low fat foods, etc. the first few days at home. Gradually advance to a solid, low-fat, high fiber diet by the end of the first week at home.   Add a fiber supplement to your diet (Metamucil, etc) If you feel full, bloated, or constipated, stay on a full liquid or pureed/blenderized diet for a few days until you feel better and are no longer constipated.   Discharge instructions   Complete by: As directed    See Discharge Instructions If you are not getting better after two weeks or are noticing you are getting worse, contact our office [phone] for further advice.  We may need to adjust your medications, re-evaluate you in the office, send you to the emergency room, or see what other things we can do to help. The clinic staff is available to answer your questions during regular business hours (8:30am-5pm).  Please don't hesitate to call and ask to speak to one of our nurses for clinical concerns.    A surgeon from [facility] is always on call at the hospitals 24 hours/day If you have a medical emergency, go to the nearest emergency room or call 911.   Discharge wound care:   Complete by: As directed    It is good for closed incisions and even open wounds to be washed every day.  Shower every day.  Short baths are fine.  Wash the incisions and wounds clean with soap & water.    You may leave closed incisions open to air if it is dry.   You may cover the incision with clean gauze & replace it after your daily shower for comfort.  STERISTRIPS:  You have skin tapes called Steristrips on your incisions.  Leave them in place, and they will fall off on their own like a scab in 1-3 weeks.  You may trim any edges that curl up with clean scissors.  You may remove them in the shower in a week.  TEGADERM:  You have clear gauze band-aid  dressings over your closed incision(s).  Remove the dressings 3 days after surgery.   Driving Restrictions   Complete by: As directed    You may drive when: - you are no longer taking narcotic prescription pain medication - you can comfortably wear a seatbelt - you can safely make sudden turns/stops without pain.   Increase activity slowly   Complete by: As directed    Start light daily activities --- self-care, walking, climbing stairs- beginning the day after surgery.  Gradually increase activities as tolerated.  Control your pain to be active.  Stop when you are tired.  Ideally, walk several times a day, eventually an hour a day.   Most people are back to most day-to-day activities in a few weeks.  It takes 4-6 weeks to get back to unrestricted, intense activity. If you can walk 30 minutes without difficulty,  it is safe to try more intense activity such as jogging, treadmill, bicycling, low-impact aerobics, swimming, etc. Save the most intensive and strenuous activity for last (Usually 4-8 weeks after surgery) such as sit-ups, heavy lifting, contact sports, etc.  Refrain from any intense heavy lifting or straining until you are off narcotics for pain control.  You will have off days, but things should improve week-by-week. DO NOT PUSH THROUGH PAIN.  Let pain be your guide: If it hurts to do something, don't do it.   Lifting restrictions   Complete by: As directed    If you can walk 30 minutes without difficulty, it is safe to try more intense activity such as jogging, treadmill, bicycling, low-impact aerobics, swimming, etc. Save the most intensive and strenuous activity for last (Usually 4-8 weeks after surgery) such as sit-ups, heavy lifting, contact sports, etc.   Refrain from any intense heavy lifting or straining until you are off narcotics for pain control.  You will have off days, but things should improve week-by-week. DO NOT PUSH THROUGH PAIN.  Let pain be your guide: If it hurts to do  something, don't do it.  Pain is your body warning you to avoid that activity for another week until the pain goes down.   May shower / Bathe   Complete by: As directed    May walk up steps   Complete by: As directed    Remove dressing in 72 hours   Complete by: As directed    Make sure all dressings are removed by the third day after surgery.  Leave incisions open to air.  OK to cover incisions with gauze or bandages as desired   Sexual Activity Restrictions   Complete by: As directed    You may have sexual intercourse when it is comfortable. If it hurts to do something, stop.      Allergies as of 08/28/2020   No Known Allergies     Medication List    TAKE these medications   amitriptyline 10 MG tablet Commonly known as: ELAVIL Take 10 mg by mouth at bedtime.   atorvastatin 10 MG tablet Commonly known as: LIPITOR Take 1 tablet by mouth at bedtime.   cholecalciferol 25 MCG (1000 UNIT) tablet Commonly known as: VITAMIN D3 Take 2,000 Units by mouth at bedtime.   lisinopril-hydrochlorothiazide 20-12.5 MG tablet Commonly known as: ZESTORETIC Take 1 tablet by mouth at bedtime.   metFORMIN 500 MG tablet Commonly known as: GLUCOPHAGE Take 1 tablet by mouth every evening.   metoprolol succinate 50 MG 24 hr tablet Commonly known as: TOPROL-XL Take 50 mg by mouth every evening.   omeprazole 40 MG capsule Commonly known as: PRILOSEC Take 1 capsule by mouth every evening.   oxyCODONE 5 MG immediate release tablet Commonly known as: Oxy IR/ROXICODONE Take 1-2 tablets (5-10 mg total) by mouth every 4 (four) hours as needed for severe pain or breakthrough pain.   rOPINIRole 0.25 MG tablet Commonly known as: REQUIP Take 0.25 mg by mouth at bedtime.   Victoza 18 MG/3ML Sopn Generic drug: liraglutide Inject 0.6 mg into the skin daily.            Discharge Care Instructions  (From admission, onward)         Start     Ordered   08/28/20 0000  Discharge wound care:        Comments: It is good for closed incisions and even open wounds to be washed every day.  Shower every  day.  Short baths are fine.  Wash the incisions and wounds clean with soap & water.    You may leave closed incisions open to air if it is dry.   You may cover the incision with clean gauze & replace it after your daily shower for comfort.  STERISTRIPS:  You have skin tapes called Steristrips on your incisions.  Leave them in place, and they will fall off on their own like a scab in 1-3 weeks.  You may trim any edges that curl up with clean scissors.  You may remove them in the shower in a week.  TEGADERM:  You have clear gauze band-aid dressings over your closed incision(s).  Remove the dressings 3 days after surgery.   08/28/20 0947          Significant Diagnostic Studies:  Results for orders placed or performed during the hospital encounter of 08/26/20 (from the past 72 hour(s))  Urinalysis, Routine w reflex microscopic Urine, Clean Catch     Status: Abnormal   Collection Time: 08/26/20  4:08 PM  Result Value Ref Range   Color, Urine YELLOW YELLOW   APPearance CLEAR CLEAR   Specific Gravity, Urine 1.026 1.005 - 1.030   pH 6.0 5.0 - 8.0   Glucose, UA NEGATIVE NEGATIVE mg/dL   Hgb urine dipstick NEGATIVE NEGATIVE   Bilirubin Urine NEGATIVE NEGATIVE   Ketones, ur 20 (A) NEGATIVE mg/dL   Protein, ur 30 (A) NEGATIVE mg/dL   Nitrite NEGATIVE NEGATIVE   Leukocytes,Ua NEGATIVE NEGATIVE   RBC / HPF 0-5 0 - 5 RBC/hpf   WBC, UA 0-5 0 - 5 WBC/hpf   Bacteria, UA NONE SEEN NONE SEEN   Mucus PRESENT    Hyaline Casts, UA PRESENT     Comment: Performed at [facility], [address], [place], [place] [address]  Lipase, blood     Status: None   Collection Time: 08/26/20  4:16 PM  Result Value Ref Range   Lipase 51 11 - 51 U/L    Comment: Performed at [facility], [address] [place], [place] [address]  Comprehensive metabolic panel     Status:  Abnormal   Collection Time: 08/26/20  4:16 PM  Result Value Ref Range   Sodium 136 135 - 145 mmol/L   Potassium 3.9 3.5 - 5.1 mmol/L   Chloride 100 98 - 111 mmol/L   CO2 25 22 - 32 mmol/L   Glucose, Bld 136 (H) 70 - 99 mg/dL    Comment: Glucose reference range applies only to samples taken after fasting for at least 8 hours.   BUN 15 6 - 20 mg/dL   Creatinine, Ser 8.29 0.61 - 1.24 mg/dL   Calcium 9.7 8.9 - 56.2 mg/dL   Total Protein 8.0 6.5 - 8.1 g/dL   Albumin 4.1 3.5 - 5.0 g/dL   AST 31 15 - 41 U/L   ALT 56 (H) 0 - 44 U/L   Alkaline Phosphatase 81 38 - 126 U/L   Total Bilirubin 0.4 0.3 - 1.2 mg/dL   GFR, Estimated >13 >08 mL/min    Comment: (NOTE) Calculated using the CKD-EPI Creatinine Equation (2021)    Anion gap 11 5 - 15    Comment: Performed at [facility], [address] [place], [place] [address]  CBC     Status: Abnormal   Collection Time: 08/26/20  4:16 PM  Result Value Ref Range   WBC 12.3 (H) 4.0 - 10.5  K/uL   RBC 5.35 4.22 - 5.81 MIL/uL   Hemoglobin 14.0 13.0 - 17.0 g/dL   HCT 01.7 51.0 - 25.8 %   MCV 82.6 80.0 - 100.0 fL   MCH 26.2 26.0 - 34.0 pg   MCHC 31.7 30.0 - 36.0 g/dL   RDW 52.7 78.2 - 42.3 %   Platelets 318 150 - 400 K/uL   nRBC 0.0 0.0 - 0.2 %    Comment: Performed at [facility], [address] [place], [place] [address]  Hemoglobin A1c     Status: Abnormal   Collection Time: 08/26/20  4:16 PM  Result Value Ref Range   Hgb A1c MFr Bld 7.5 (H) 4.8 - 5.6 %    Comment: (NOTE) Pre diabetes:          5.7%-6.4%  Diabetes:              >6.4%  Glycemic control for   <7.0% adults with diabetes    Mean Plasma Glucose 168.55 mg/dL    Comment: Performed at [facility], [address] [place], [place] [address]  CBG monitoring, ED     Status: Abnormal   Collection Time: 08/26/20 11:14 PM  Result Value Ref Range   Glucose-Capillary 114 (H) 70 - 99 mg/dL    Comment: Glucose reference range applies only  to samples taken after fasting for at least 8 hours.  Resp Panel by RT-PCR (Flu A&B, Covid)     Status: None   Collection Time: 08/26/20 11:22 PM  Result Value Ref Range   SARS Coronavirus 2 by RT PCR NEGATIVE NEGATIVE    Comment: (NOTE) SARS-CoV-2 target nucleic acids are NOT DETECTED.  The SARS-CoV-2 RNA is generally detectable in upper respiratory specimens during the acute phase of infection. The lowest concentration of SARS-CoV-2 viral copies this assay can detect is 138 copies/mL. A negative result does not preclude SARS-Cov-2 infection and should not be used as the sole basis for treatment or other patient management decisions. A negative result may occur with  improper specimen collection/handling, submission of specimen other than nasopharyngeal swab, presence of viral mutation(s) within the areas targeted by this assay, and inadequate number of viral copies(<138 copies/mL). A negative result must be combined with clinical observations, patient history, and epidemiological information. The expected result is Negative.  Fact Sheet for Patients:  BloggerCourse.com  Fact Sheet for Healthcare Providers:  SeriousBroker.it  This test is no t yet approved or cleared by the [place] FDA and  has been authorized for detection and/or diagnosis of SARS-CoV-2 by FDA under an Emergency Use Authorization (EUA). This EUA will remain  in effect (meaning this test can be used) for the duration of the COVID-19 declaration under Section 564(b)(1) of the Act, 21 U.S.C.section 360bbb-3(b)(1), unless the authorization is terminated  or revoked sooner.       Influenza A by PCR NEGATIVE NEGATIVE   Influenza B by PCR NEGATIVE NEGATIVE    Comment: (NOTE) The Xpert Xpress SARS-CoV-2/FLU/RSV plus assay is intended as an aid in the diagnosis of influenza from Nasopharyngeal swab specimens and should not be used as a sole basis for treatment.  Nasal washings and aspirates are unacceptable for Xpert Xpress SARS-CoV-2/FLU/RSV testing.  Fact Sheet for Patients: BloggerCourse.com  Fact Sheet for Healthcare Providers: SeriousBroker.it  This test is not yet approved or cleared by the [place] FDA and has been authorized for detection and/or diagnosis of SARS-CoV-2 by FDA under an Emergency Use Authorization (EUA).  This EUA will remain in effect (meaning this test can be used) for the duration of the COVID-19 declaration under Section 564(b)(1) of the Act, 21 U.S.C. section 360bbb-3(b)(1), unless the authorization is terminated or revoked.  Performed at [facility], [address], [place], [place] [address]   HIV Antibody (routine testing w rflx)     Status: None   Collection Time: 08/26/20 11:24 PM  Result Value Ref Range   HIV Screen 4th Generation wRfx Non Reactive Non Reactive    Comment: Performed at [facility], [address], [place], [place] [address]  Glucose, capillary     Status: Abnormal   Collection Time: 08/27/20  2:19 AM  Result Value Ref Range   Glucose-Capillary 120 (H) 70 - 99 mg/dL    Comment: Glucose reference range applies only to samples taken after fasting for at least 8 hours.  Glucose, capillary     Status: Abnormal   Collection Time: 08/27/20  7:38 AM  Result Value Ref Range   Glucose-Capillary 117 (H) 70 - 99 mg/dL    Comment: Glucose reference range applies only to samples taken after fasting for at least 8 hours.  Surgical pcr screen     Status: Abnormal   Collection Time: 08/27/20  7:59 AM   Specimen: Nasal Mucosa; Nasal Swab  Result Value Ref Range   MRSA, PCR NEGATIVE NEGATIVE   Staphylococcus aureus POSITIVE (A) NEGATIVE    Comment: (NOTE) The Xpert SA Assay (FDA approved for NASAL specimens in patients 22 years of age and older), is one component of a comprehensive surveillance program. It is not  intended to diagnose infection nor to guide or monitor treatment. Performed at [facility], [address], [place], [place] [address]   Glucose, capillary     Status: Abnormal   Collection Time: 08/27/20 11:15 AM  Result Value Ref Range   Glucose-Capillary 151 (H) 70 - 99 mg/dL    Comment: Glucose reference range applies only to samples taken after fasting for at least 8 hours.  Glucose, capillary     Status: Abnormal   Collection Time: 08/27/20 12:16 PM  Result Value Ref Range   Glucose-Capillary 188 (H) 70 - 99 mg/dL    Comment: Glucose reference range applies only to samples taken after fasting for at least 8 hours.  Glucose, capillary     Status: Abnormal   Collection Time: 08/27/20  4:08 PM  Result Value Ref Range   Glucose-Capillary 214 (H) 70 - 99 mg/dL    Comment: Glucose reference range applies only to samples taken after fasting for at least 8 hours.  Glucose, capillary     Status: Abnormal   Collection Time: 08/27/20  7:43 PM  Result Value Ref Range   Glucose-Capillary 230 (H) 70 - 99 mg/dL    Comment: Glucose reference range applies only to samples taken after fasting for at least 8 hours.  Glucose, capillary     Status: Abnormal   Collection Time: 08/27/20 11:52 PM  Result Value Ref Range   Glucose-Capillary 125 (H) 70 - 99 mg/dL    Comment: Glucose reference range applies only to samples taken after fasting for at least 8 hours.  Glucose, capillary     Status: Abnormal   Collection Time: 08/28/20  3:52 AM  Result Value Ref Range   Glucose-Capillary 145 (H) 70 - 99 mg/dL    Comment: Glucose reference range applies only to samples taken after fasting for at least  8 hours.  Glucose, capillary     Status: Abnormal   Collection Time: 08/28/20  7:05 AM  Result Value Ref Range   Glucose-Capillary 120 (H) 70 - 99 mg/dL    Comment: Glucose reference range applies only to samples taken after fasting for at least 8 hours.    CT Abdomen Pelvis Wo  Contrast  Result Date: 08/26/2020 CLINICAL DATA:  Periumbilical abdominal pain, constipation. EXAM: CT ABDOMEN AND PELVIS WITHOUT CONTRAST TECHNIQUE: Multidetector CT imaging of the abdomen and pelvis was performed following the standard protocol without IV contrast. COMPARISON:  None. FINDINGS: Lower chest: No acute abnormality. Hepatobiliary: No focal liver abnormality is seen. No gallstones, gallbladder wall thickening, or biliary dilatation. Pancreas: Unremarkable. No pancreatic ductal dilatation or surrounding inflammatory changes. Spleen: Normal in size without focal abnormality. Adrenals/Urinary Tract: Adrenal glands are unremarkable. Kidneys are normal, without renal calculi, focal lesion, or hydronephrosis. Bladder is unremarkable. Stomach/Bowel: The stomach appears normal. Diverticulosis is noted throughout the colon without inflammation. Large supraumbilical ventral hernia is noted which contains a loop of small bowel; this results in mild dilatation of the more proximal small bowel loops suggesting some degree of obstruction. Vascular/Lymphatic: No significant vascular findings are present. No enlarged abdominal or pelvic lymph nodes. Reproductive: Prostate is unremarkable. Other: No ascites is noted. Musculoskeletal: No acute or significant osseous findings. IMPRESSION: Large supraumbilical ventral hernia is noted which contains a loop of small bowel, which results in mild dilatation of the more proximal small bowel loop suggesting some degree of obstruction. Diverticulosis is noted throughout the colon without inflammation. Electronically Signed   By: Lupita Raider M.D.   On: 08/26/2020 17:34    Past Medical History:  Diagnosis Date  . Diabetes mellitus without complication (HCC)   . Hypertension   . Umbilical hernia     Past Surgical History:  Procedure Laterality Date  . HERNIA REPAIR      Social History   Socioeconomic History  . Marital status: Married    Spouse name: Not on  file  . Number of children: Not on file  . Years of education: Not on file  . Highest education level: Not on file  Occupational History  . Not on file  Tobacco Use  . Smoking status: Current Every Day Smoker    Packs/day: 1.00    Types: Cigarettes  . Smokeless tobacco: Never Used  Vaping Use  . Vaping Use: Never used  Substance and Sexual Activity  . Alcohol use: Never  . Drug use: Never  . Sexual activity: Not on file  Other Topics Concern  . Not on file  Social History Narrative  . Not on file   Social Determinants of Health   Financial Resource Strain: Not on file  Food Insecurity: Not on file  Transportation Needs: Not on file  Physical Activity: Not on file  Stress: Not on file  Social Connections: Not on file  Intimate Partner Violence: Not on file    Family History  Family history unknown: Yes    Current Facility-Administered Medications  Medication Dose Route Frequency Provider Last Rate Last Admin  . 0.45 % NaCl with KCl 20 mEq / L infusion   Intravenous Continuous Karie Soda, MD 100 mL/hr at 08/27/20 2354 New Bag at 08/27/20 2354  . 0.9 %  sodium chloride infusion  250 mL Intravenous PRN Karie Soda, MD      . acetaminophen (TYLENOL) tablet 650 mg  650 mg Oral Q6H PRN Karie Soda, MD  Or  . acetaminophen (TYLENOL) suppository 650 mg  650 mg Rectal Q6H PRN Karie Soda, MD      . acetaminophen (TYLENOL) tablet 1,000 mg  1,000 mg Oral Trecia Rogers, MD   1,000 mg at 08/28/20 0507  . alum & mag hydroxide-simeth (MAALOX/MYLANTA) 200-200-20 MG/5ML suspension 30 mL  30 mL Oral Q6H PRN Karie Soda, MD      . amitriptyline (ELAVIL) tablet 10 mg  10 mg Oral Laurena Slimmer, MD   10 mg at 08/27/20 2216  . atorvastatin (LIPITOR) tablet 10 mg  10 mg Oral Laurena Slimmer, MD   10 mg at 08/27/20 2216  . bisacodyl (DULCOLAX) suppository 10 mg  10 mg Rectal Q12H PRN Karie Soda, MD      . bupivacaine liposome (EXPAREL) 1.3 % injection 266 mg  20 mL  Infiltration Once Karie Soda, MD      . Chlorhexidine Gluconate Cloth 2 % PADS 6 each  6 each Topical Once Karie Soda, MD      . diphenhydrAMINE (BENADRYL) injection 12.5-25 mg  12.5-25 mg Intravenous Q6H PRN Karie Soda, MD      . feeding supplement (ENSURE PRE-SURGERY) liquid 296 mL  296 mL Oral Once Karie Soda, MD      . feeding supplement (ENSURE PRE-SURGERY) liquid 592 mL  592 mL Oral Once Karie Soda, MD      . gabapentin (NEURONTIN) capsule 300 mg  300 mg Oral Laurena Slimmer, MD   300 mg at 08/27/20 2215  . lisinopril (ZESTRIL) tablet 20 mg  20 mg Oral Laurena Slimmer, MD   20 mg at 08/27/20 2216   And  . hydrochlorothiazide (MICROZIDE) capsule 12.5 mg  12.5 mg Oral Laurena Slimmer, MD   12.5 mg at 08/27/20 2215  . HYDROmorphone (DILAUDID) injection 0.5-2 mg  0.5-2 mg Intravenous Q2H PRN Karie Soda, MD      . HYDROmorphone (DILAUDID) injection 1 mg  1 mg Intravenous Q2H PRN Karie Soda, MD   0.5 mg at 08/27/20 1055  . ibuprofen (ADVIL) tablet 400-800 mg  400-800 mg Oral Q6H PRN Karie Soda, MD   400 mg at 08/27/20 2215  . insulin aspart (novoLOG) injection 0-15 Units  0-15 Units Subcutaneous Malachi Pro, MD   2 Units at 08/28/20 0500  . lactated ringers bolus 1,000 mL  1,000 mL Intravenous Q8H PRN Karie Soda, MD      . lip balm (CARMEX) ointment 1 application  1 application Topical BID Karie Soda, MD   1 application at 08/27/20 2217  . liraglutide (VICTOZA) SOPN 0.6 mg  0.6 mg Subcutaneous Daily Karie Soda, MD      . magic mouthwash  15 mL Oral QID PRN Karie Soda, MD      . menthol-cetylpyridinium (CEPACOL) lozenge 3 mg  1 lozenge Oral PRN Karie Soda, MD      . metFORMIN (GLUCOPHAGE) tablet 500 mg  500 mg Oral Q supper Karie Soda, MD   500 mg at 08/27/20 1646  . methocarbamol (ROBAXIN) 1,000 mg in dextrose 5 % 100 mL IVPB  1,000 mg Intravenous Q6H PRN Karie Soda, MD      . metoprolol succinate (TOPROL-XL) 24 hr tablet 50 mg  50 mg Oral  QPM Karie Soda, MD   50 mg at 08/26/20 2325  . metoprolol tartrate (LOPRESSOR) injection 5 mg  5 mg Intravenous Q6H PRN Karie Soda, MD      . metoprolol tartrate (LOPRESSOR) tablet 12.5 mg  12.5 mg Oral Q12H PRN Karie Soda, MD      . mupirocin ointment (BACTROBAN) 2 % 1 application  1 application Nasal BID Karie Soda, MD      . ondansetron [facility]) injection 4 mg  4 mg Intravenous Q6H PRN Karie Soda, MD       Or  . ondansetron (ZOFRAN) 8 mg in sodium chloride 0.9 % 50 mL IVPB  8 mg Intravenous Q6H PRN Karie Soda, MD      . ondansetron (ZOFRAN-ODT) disintegrating tablet 4 mg  4 mg Oral Q6H PRN Karie Soda, MD       Or  . ondansetron [facility]) injection 4 mg  4 mg Intravenous Q6H PRN Karie Soda, MD   4 mg at 08/26/20 2256  . oxyCODONE (Oxy IR/ROXICODONE) immediate release tablet 5-10 mg  5-10 mg Oral Q4H PRN Karie Soda, MD   5 mg at 08/27/20 2043  . oxyCODONE (ROXICODONE) 5 MG/5ML solution 5-10 mg  5-10 mg Oral Q4H PRN Karie Soda, MD      . pantoprazole (PROTONIX) injection 40 mg  40 mg Intravenous Q24H Karie Soda, MD   40 mg at 08/27/20 2015  . phenol (CHLORASEPTIC) mouth spray 2 spray  2 spray Mouth/Throat PRN Karie Soda, MD      . prochlorperazine (COMPAZINE) injection 5-10 mg  5-10 mg Intravenous Q4H PRN Karie Soda, MD      . psyllium (HYDROCIL/METAMUCIL) 1 packet  1 packet Oral BID Karie Soda, MD   1 packet at 08/27/20 2216  . rOPINIRole (REQUIP) tablet 0.25 mg  0.25 mg Oral Laurena Slimmer, MD   0.25 mg at 08/27/20 2216  . simethicone (MYLICON) 40 MG/0.6ML suspension 80 mg  80 mg Oral QID PRN Karie Soda, MD      . sodium chloride flush (NS) 0.9 % injection 3 mL  3 mL Intravenous Catha Gosselin, MD   3 mL at 08/27/20 2217  . sodium chloride flush (NS) 0.9 % injection 3 mL  3 mL Intravenous PRN Karie Soda, MD      . traMADol Janean Sark) tablet 50 mg  50 mg Oral Q6H PRN Darnell Level, MD   50 mg at 08/27/20 1659     No Known  Allergies  Signed: Lorenso Courier, MD, FACS, MASCRS  Esophageal, Gastrointestinal & Colorectal Surgery Robotic and Minimally Invasive Surgery [facility] [address] [place], [place] [address] [phone] Fax [phone] Main/Paging  CONTACT INFORMATION: Weekday (9AM-5PM) concerns: Call [facility] main office at [phone] Weeknight (5PM-9AM) or Weekend/Holiday concerns: Check [email or web address] for General Surgery [facility] coverage (Please, do not use SecureChat as it is not reliable communication to operating surgeons for immediate patient care)      08/28/2020, 9:48 AM

## 2020-08-28 NOTE — Progress Notes (Signed)
Patient was given discharge instructions, and all questions were answered.  Patient was stable for discharge and was taken to the main exit by wheelchair. 

## 2020-08-28 NOTE — Discharge Instructions (Signed)
[facility]ERNIA REPAIR: POST OP INSTRUCTIONS  ######################################################################  EAT Gradually transition to a high fiber diet with a fiber supplement over the next few weeks after discharge.  Start with a pureed / full liquid diet (see below)  WALK Walk an hour a day.  Control your pain to do that.    CONTROL PAIN Control pain so that you can walk, sleep, tolerate sneezing/coughing, and go up/down stairs.  HAVE A BOWEL MOVEMENT DAILY Keep your bowels regular to avoid problems.  OK to try a laxative to override constipation.  OK to use an antidairrheal to slow down diarrhea.  Call if not better after 2 tries  CALL IF YOU HAVE PROBLEMS/CONCERNS Call if you are still struggling despite following these instructions. Call if you have concerns not answered by these instructions  ######################################################################    1. DIET: Follow a light bland diet & liquids the first 24 hours after arrival home, such as soup, liquids, starches, etc.  Be sure to drink plenty of fluids.  Quickly advance to a usual solid diet within a few days.  Avoid fast food or heavy meals as your are more likely to get nauseated or have irregular bowels.  A low-fat, high-fiber diet for the rest of your life is ideal.   2. Take your usually prescribed home medications unless otherwise directed.  3. PAIN CONTROL: a. Pain is best controlled by a usual combination of three different methods TOGETHER: i. Ice/Heat ii. Over the counter pain medication iii. Prescription pain medication b. Most patients will experience some swelling and bruising around the hernia(s) such as the bellybutton, groins, or old incisions.  Ice packs or heating pads (30-60 minutes up to 6 times a day) will help. Use ice for the first few days to help decrease swelling and bruising, then switch to heat to help relax tight/sore spots and speed recovery.  Some people prefer to use ice  alone, heat alone, alternating between ice & heat.  Experiment to what works for you.  Swelling and bruising can take several weeks to resolve.   c. It is helpful to take an over-the-counter pain medication regularly for the first few weeks.  Choose one of the following that works best for you: i. Naproxen (Aleve, etc)  Two 244m tabs twice a day ii. Ibuprofen (Advil, etc) Three 2036mtabs four times a day (every meal & bedtime) iii. Acetaminophen (Tylenol, etc) 325-65016mour times a day (every meal & bedtime) d. A  prescription for pain medication should be given to you upon discharge.  Take your pain medication as prescribed.  i. If you are having problems/concerns with the prescription medicine (does not control pain, nausea, vomiting, rash, itching, etc), please call us [phone] see if we need to switch you to a different pain medicine that will work better for you and/or control your side effect better. ii. If you need a refill on your pain medication, please contact your pharmacy.  They will contact our office to request authorization. Prescriptions will not be filled after 5 pm or on week-ends.  4. Avoid getting constipated.  Between the surgery and the pain medications, it is common to experience some constipation.  Increasing fluid intake and taking a fiber supplement (such as Metamucil, Citrucel, FiberCon, MiraLax, etc) 1-2 times a day regularly will usually help prevent this problem from occurring.  A mild laxative (prune juice, Milk of Magnesia, MiraLax, etc) should be taken according to package directions if there are no bowel movements after 48  hours.    5. Wash / shower every day.  You may shower over the dressings as they are waterproof.    6. Remove your waterproof bandages, skin tapes, and shoelace wicks 3 days after surgery = Tuesday 5/25. You may replace a dressing/Band-Aid to cover the incision for comfort if you wish. You may leave the incisions open to air.  You may  replace a dressing/Band-Aid to cover an incision for comfort if you wish.  Continue to shower over incision(s) after the dressing is off.  7. ACTIVITIES as tolerated:   a. You may resume regular (light) daily activities beginning the next day--such as daily self-care, walking, climbing stairs--gradually increasing activities as tolerated.  Control your pain so that you can walk an hour a day.  If you can walk 30 minutes without difficulty, it is safe to try more intense activity such as jogging, treadmill, bicycling, low-impact aerobics, swimming, etc. b. Save the most intensive and strenuous activity for last such as sit-ups, heavy lifting, contact sports, etc  Refrain from any heavy lifting or straining until you are off narcotics for pain control.   c. DO NOT PUSH THROUGH PAIN.  Let pain be your guide: If it hurts to do something, don't do it.  Pain is your body warning you to avoid that activity for another week until the pain goes down. d. You may drive when you are no longer taking prescription pain medication, you can comfortably wear a seatbelt, and you can safely maneuver your car and apply brakes. e. Bonita QuinYou may have sexual intercourse when it is comfortable.   8. FOLLOW UP in our office a. Please call [facility] at [phone] to set up an appointment to see your surgeon in the office for a follow-up appointment approximately 2-3 weeks after your surgery. b. Make sure that you call for this appointment the day you arrive home to insure a convenient appointment time.  9.  If you have disability of FMLA / Family leave forms, please bring the forms to the office for processing.  (do not give to your surgeon).  WHEN TO CALL US [phone]) [phone]: 1. Poor pain control 2. Reactions / problems with new medications (rash/itching, nausea, etc)  3. Fever over 101.5 F (38.5 C) 4. Inability to urinate 5. Nausea and/or vomiting 6. Worsening swelling or bruising 7. Continued bleeding from  incision. 8. Increased pain, redness, or drainage from the incision   The clinic staff is available to answer your questions during regular business hours (8:30am-5pm).  Please don't hesitate to call and ask to speak to one of our nurses for clinical concerns.   If you have a medical emergency, go to the nearest emergency room or call 911.  [facility] from [facility] is always on call at the hospitals in [facility]  [facility], [place] [address], [address], [place], [place]  [address] ?  [address] [address], [place], [place]   [address] MAIN: [phone](336) [phone] ? TOLL FREE: [phone] ? FAX: [phone] [email or web address]    STOP SMOKING!  We strongly recommend that you stop smoking.  Smoking increases the risk of surgery including infection in the form of an open wound, pus formation, abscess, hernia at an incision on the abdomen, etc.  You have an increased risk of other MAJOR complications such as stroke, heart attack, forming clots in the leg and/or lungs, and death.    Smoking Cessation Quitting smoking is important to your health and has many advantages.  However, it is not always easy to quit since nicotine is a very addictive drug. Often times, people try 3 times or more before being able to quit. This document explains the best ways for you to prepare to quit smoking. Quitting takes hard work and a lot of effort, but you can do it. ADVANTAGES OF QUITTING SMOKING  You will live longer, feel better, and live better.  Your body will feel the impact of quitting smoking almost immediately.  Within 20 minutes, blood pressure decreases. Your pulse returns to its normal level.  After 8 hours, carbon monoxide levels in the blood return to normal. Your oxygen level increases.  After 24 hours, the chance of having a heart attack starts to decrease. Your breath, hair, and body stop smelling like smoke.  After 48 hours, damaged nerve endings begin to  recover. Your sense of taste and smell improve.  After 72 hours, the body is virtually free of nicotine. Your bronchial tubes relax and breathing becomes easier.  After 2 to 12 weeks, lungs can hold more air. Exercise becomes easier and circulation improves.  The risk of having a heart attack, stroke, cancer, or lung disease is greatly reduced.  After 1 year, the risk of coronary heart disease is cut in half.  After 5 years, the risk of stroke falls to the same as a nonsmoker.  After 10 years, the risk of lung cancer is cut in half and the risk of other cancers decreases significantly.  After 15 years, the risk of coronary heart disease drops, usually to the level of a nonsmoker.  If you are pregnant, quitting smoking will improve your chances of having a healthy baby.  The people you live with, especially any children, will be healthier.  You will have extra money to spend on things other than cigarettes. QUESTIONS TO THINK ABOUT BEFORE ATTEMPTING TO QUIT You may want to talk about your answers with your caregiver.  Why do you want to quit?  If you tried to quit in the past, what helped and what did not?  What will be the most difficult situations for you after you quit? How will you plan to handle them?  Who can help you through the tough times? Your family? Friends? A caregiver?  What pleasures do you get from smoking? What ways can you still get pleasure if you quit? Here are some questions to ask your caregiver:  How can you help me to be successful at quitting?  What medicine do you think would be best for me and how should I take it?  What should I do if I need more help?  What is smoking withdrawal like? How can I get information on withdrawal? GET READY  Set a quit date.  Change your environment by getting rid of all cigarettes, ashtrays, matches, and lighters in your home, car, or work. Do not let people smoke in your home.  Review your past attempts to quit.  Think about what worked and what did not. GET SUPPORT AND ENCOURAGEMENT You have a better chance of being successful if you have help. You can get support in many ways.  Tell your family, friends, and co-workers that you are going to quit and need their support. Ask them not to smoke around you.  Get individual, group, or telephone counseling and support. Programs are available at [employer] and health centers. Call your local health department for information about programs in your area.  Spiritual beliefs and practices may  help some smokers quit.  Download a "quit meter" on your computer to keep track of quit statistics, such as how long you have gone without smoking, cigarettes not smoked, and money saved.  Get a self-help book about quitting smoking and staying off of tobacco. LEARN NEW SKILLS AND BEHAVIORS  Distract yourself from urges to smoke. Talk to someone, go for a walk, or occupy your time with a task.  Change your normal routine. Take a different route to work. Drink tea instead of coffee. Eat breakfast in a different place.  Reduce your stress. Take a hot bath, exercise, or read a book.  Plan something enjoyable to do every day. Reward yourself for not smoking.  Explore interactive web-based programs that specialize in helping you quit. GET MEDICINE AND USE IT CORRECTLY Medicines can help you stop smoking and decrease the urge to smoke. Combining medicine with the above behavioral methods and support can greatly increase your chances of successfully quitting smoking.  Nicotine replacement therapy helps deliver nicotine to your body without the negative effects and risks of smoking. Nicotine replacement therapy includes nicotine gum, lozenges, inhalers, nasal sprays, and skin patches. Some may be available over-the-counter and others require a prescription.  Antidepressant medicine helps people abstain from smoking, but how this works is unknown. This medicine is  available by prescription.  Nicotinic receptor partial agonist medicine simulates the effect of nicotine in your brain. This medicine is available by prescription. Ask your caregiver for advice about which medicines to use and how to use them based on your health history. Your caregiver will tell you what side effects to look out for if you choose to be on a medicine or therapy. Carefully read the information on the package. Do not use any other product containing nicotine while using a nicotine replacement product.  RELAPSE OR DIFFICULT SITUATIONS Most relapses occur within the first 3 months after quitting. Do not be discouraged if you start smoking again. Remember, most people try several times before finally quitting. You may have symptoms of withdrawal because your body is used to nicotine. You may crave cigarettes, be irritable, feel very hungry, cough often, get headaches, or have difficulty concentrating. The withdrawal symptoms are only temporary. They are strongest when you first quit, but they will go away within 10 14 days. To reduce the chances of relapse, try to:  Avoid drinking alcohol. Drinking lowers your chances of successfully quitting.  Reduce the amount of caffeine you consume. Once you quit smoking, the amount of caffeine in your body increases and can give you symptoms, such as a rapid heartbeat, sweating, and anxiety.  Avoid smokers because they can make you want to smoke.  Do not let weight gain distract you. Many smokers will gain weight when they quit, usually less than 10 pounds. Eat a healthy diet and stay active. You can always lose the weight gained after you quit.  Find ways to improve your mood other than smoking. FOR MORE INFORMATION  www.smokefree.gov    While it can be one of the most difficult things to do, the [place] community has programs to help you stop.  Consider talking with your primary care physician about options.  Also, Smoking Cessation classes are  available through the [facility]:  The smoking cessation program is a proven-effective program from the American Lung Association. The program is available for anyone 69 and older who currently smokes. The program lasts for 7 weeks and is 8 sessions. Each class will be approximately  1 1/2 hours. The program is every Tuesday.  All classes are 12-1:30pm and same location.  Event Location Information:  Location: [facility] 2nd Floor Conference Room 2-037; located next to [facility] cross streets: [facility] Entrance into the [facility] is adjacent to the [facility] main entrance. The conference room is located on the 2nd floor.  Parking Instructions: Visitor parking is adjacent to [employer] main entrance and the Cancer Center    A smoking cessation program is also offered through the [facility] Register online at [email or web address] or call [phone] for more information.   Tobacco cessation counseling is available at [facility] [place]. Call [phone] for a free appointment.   Tobacco cessation classes also are available through the [facility] in [place]. For information, call [phone].   The Patient Education Network features videos on tobacco cessation. Please consult your listings in the center of this book to find instructions on how to access this resource.   If you want more information, ask your nurse.

## 2020-08-29 ENCOUNTER — Encounter (HOSPITAL_COMMUNITY): Payer: Self-pay | Admitting: Surgery

## 2020-10-17 DIAGNOSIS — E559 Vitamin D deficiency, unspecified: Secondary | ICD-10-CM | POA: Diagnosis not present

## 2020-10-17 DIAGNOSIS — E1165 Type 2 diabetes mellitus with hyperglycemia: Secondary | ICD-10-CM | POA: Diagnosis not present

## 2020-10-17 DIAGNOSIS — E785 Hyperlipidemia, unspecified: Secondary | ICD-10-CM | POA: Diagnosis not present

## 2020-11-30 DIAGNOSIS — E1165 Type 2 diabetes mellitus with hyperglycemia: Secondary | ICD-10-CM | POA: Diagnosis not present

## 2020-11-30 DIAGNOSIS — I10 Essential (primary) hypertension: Secondary | ICD-10-CM | POA: Diagnosis not present

## 2020-11-30 DIAGNOSIS — E782 Mixed hyperlipidemia: Secondary | ICD-10-CM | POA: Diagnosis not present

## 2020-11-30 DIAGNOSIS — E559 Vitamin D deficiency, unspecified: Secondary | ICD-10-CM | POA: Diagnosis not present

## 2020-12-02 DIAGNOSIS — E559 Vitamin D deficiency, unspecified: Secondary | ICD-10-CM | POA: Diagnosis not present

## 2020-12-02 DIAGNOSIS — E782 Mixed hyperlipidemia: Secondary | ICD-10-CM | POA: Diagnosis not present

## 2020-12-02 DIAGNOSIS — E1165 Type 2 diabetes mellitus with hyperglycemia: Secondary | ICD-10-CM | POA: Diagnosis not present

## 2021-03-20 DIAGNOSIS — E782 Mixed hyperlipidemia: Secondary | ICD-10-CM | POA: Diagnosis not present

## 2021-03-20 DIAGNOSIS — E559 Vitamin D deficiency, unspecified: Secondary | ICD-10-CM | POA: Diagnosis not present

## 2021-03-20 DIAGNOSIS — I10 Essential (primary) hypertension: Secondary | ICD-10-CM | POA: Diagnosis not present

## 2021-03-20 DIAGNOSIS — R739 Hyperglycemia, unspecified: Secondary | ICD-10-CM | POA: Diagnosis not present

## 2021-03-22 DIAGNOSIS — I10 Essential (primary) hypertension: Secondary | ICD-10-CM | POA: Diagnosis not present

## 2021-03-22 DIAGNOSIS — E1165 Type 2 diabetes mellitus with hyperglycemia: Secondary | ICD-10-CM | POA: Diagnosis not present

## 2021-03-22 DIAGNOSIS — E559 Vitamin D deficiency, unspecified: Secondary | ICD-10-CM | POA: Diagnosis not present

## 2022-08-07 ENCOUNTER — Ambulatory Visit (HOSPITAL_COMMUNITY): Payer: Managed Care, Other (non HMO)

## 2022-08-07 ENCOUNTER — Ambulatory Visit (HOSPITAL_COMMUNITY)
Admission: EM | Admit: 2022-08-07 | Discharge: 2022-08-07 | Disposition: A | Discharge status: Home / Self Care | Payer: Managed Care, Other (non HMO) | Attending: Physician Assistant | Admitting: Physician Assistant

## 2022-08-07 ENCOUNTER — Other Ambulatory Visit: Payer: Self-pay

## 2022-08-07 ENCOUNTER — Encounter (HOSPITAL_COMMUNITY): Payer: Self-pay | Admitting: *Deleted

## 2022-08-07 DIAGNOSIS — R1030 Lower abdominal pain, unspecified: Secondary | ICD-10-CM | POA: Diagnosis present

## 2022-08-07 DIAGNOSIS — R300 Dysuria: Secondary | ICD-10-CM | POA: Insufficient documentation

## 2022-08-07 LAB — COMPREHENSIVE METABOLIC PANEL
ALT: 38 U/L (ref 0–44)
AST: 31 U/L (ref 15–41)
Albumin: 3.7 g/dL (ref 3.5–5.0)
Alkaline Phosphatase: 72 U/L (ref 38–126)
Anion gap: 15 (ref 5–15)
BUN: 14 mg/dL (ref 6–20)
CO2: 22 mmol/L (ref 22–32)
Calcium: 9.1 mg/dL (ref 8.9–10.3)
Chloride: 101 mmol/L (ref 98–111)
Creatinine, Ser: 0.85 mg/dL (ref 0.61–1.24)
GFR, Estimated: >60 mL/min (ref >60)
Glucose, Bld: 103 mg/dL — ABNORMAL HIGH (ref 70–99)
Potassium: 4.1 mmol/L (ref 3.5–5.1)
Sodium: 138 mmol/L (ref 135–145)
Total Bilirubin: 0.5 mg/dL (ref 0.3–1.2)
Total Protein: 7.6 g/dL (ref 6.5–8.1)

## 2022-08-07 LAB — CBC WITH DIFFERENTIAL/PLATELET
Abs Immature Granulocytes: 0.07 10*3/uL (ref 0.00–0.07)
Basophils Absolute: 0.1 10*3/uL (ref 0.0–0.1)
Basophils Relative: 0 %
Eosinophils Absolute: 0.2 10*3/uL (ref 0.0–0.5)
Eosinophils Relative: 1 %
HCT: 40.1 % (ref 39.0–52.0)
Hemoglobin: 12.6 g/dL — ABNORMAL LOW (ref 13.0–17.0)
Immature Granulocytes: 0 %
Lymphocytes Relative: 18 %
Lymphs Abs: 2.9 10*3/uL (ref 0.7–4.0)
MCH: 26.3 pg (ref 26.0–34.0)
MCHC: 31.4 g/dL (ref 30.0–36.0)
MCV: 83.7 fL (ref 80.0–100.0)
Monocytes Absolute: 0.9 10*3/uL (ref 0.1–1.0)
Monocytes Relative: 6 %
Neutro Abs: 12 10*3/uL — ABNORMAL HIGH (ref 1.7–7.7)
Neutrophils Relative %: 75 %
Platelets: 336 10*3/uL (ref 150–400)
RBC: 4.79 MIL/uL (ref 4.22–5.81)
RDW: 15 % (ref 11.5–15.5)
WBC: 16.1 10*3/uL — ABNORMAL HIGH (ref 4.0–10.5)
nRBC: 0 % (ref 0.0–0.2)

## 2022-08-07 LAB — POCT URINALYSIS DIP (MANUAL ENTRY)
Glucose, UA: NEGATIVE mg/dL
Leukocytes, UA: NEGATIVE
Nitrite, UA: NEGATIVE
Protein Ur, POC: 30 mg/dL — AB
Spec Grav, UA: >=1.03 — AB (ref 1.010–1.025)
Urobilinogen, UA: 2 E.U./dL — AB
pH, UA: 5.5 (ref 5.0–8.0)

## 2022-08-07 NOTE — ED Triage Notes (Signed)
Pt reports dysuria and urgency for a couple of days. Pt also has lower ABD pain.

## 2022-08-07 NOTE — Discharge Instructions (Signed)
Your x-ray was normal with no obvious kidney stone.  Your urine showed dehydration and that you have not been eating and drinking normally but did not show evidence of infection.  We will send this for culture and contact you if we need to start antibiotics.  I will contact you with your lab work if anything is abnormal.  Make sure that you are drinking plenty of water.  Avoid bladder irritants including caffeine.  Follow-up with urology; call to schedule an appointment.  If anything changes or worsens and you have increasing pain, fever, blood in your urine, nausea/vomiting, weakness, abdominal pain 1 part of your abdomen you need to go to the emergency room as we discussed.

## 2022-08-07 NOTE — ED Provider Notes (Signed)
[facility]    CSN: [id number] Arrival date & time: 08/07/22  4098      History   Chief Complaint Chief Complaint  Patient presents with   Dysuria   urgency    HPI Gregory Castillo is a 42 y.o. male.   Patient presents today with a 2 to 3-day history of urinary tract infection symptoms.  Reports dysuria and urinary urgency/frequency.  He reports lower abdominal pain that is currently rated 2/3 on a 0-10 pain scale, described as cramping, no aggravating or alleviating factors identified.  Denies any associated fever, nausea, vomiting.  He is having normal bowel movements with last bowel movement earlier today.  He denies history of UTI or nephrolithiasis.  Denies any recent urogenital procedure or catheterization.  Denies any use of over-the-counter medications or recent antibiotics in the past 90 days.  He has no concern for STI as he is in a monogamous marriage.  Denies any penile discharge, genital lesion.  He does have a history of diabetes but reports that blood sugars are well-controlled.  He does not take SGLT2 inhibitor.    Past Medical History:  Diagnosis Date   Diabetes mellitus without complication (HCC)    Hypertension    Umbilical hernia     Patient Active Problem List   Diagnosis Date Noted   SBO (small bowel obstruction) (HCC) 08/27/2020   Tobacco abuse 08/27/2020   Obesity (BMI 30-39.9) 08/27/2020   Depression 08/27/2020   Hypercholesteremia 08/27/2020   Hypertension    Diabetes mellitus without complication (HCC)    Recurrent Incarcerated incisional hernia with SBO 08/26/2020    Past Surgical History:  Procedure Laterality Date   HERNIA REPAIR     INCISIONAL HERNIA REPAIR N/A 08/27/2020   Procedure: LAPAROSCOPIC INCISIONAL HERNIA  REDUCTION AND REPAIR WITH MESH; TAP BLOCK;  Surgeon: Karie Soda, MD;  Location: [facility];  Service: General;  Laterality: N/A;       Home Medications    Prior to Admission medications   Medication Sig Start  Date End Date Taking? Authorizing Provider  amitriptyline (ELAVIL) 10 MG tablet Take 10 mg by mouth at bedtime. 08/06/20   [provider]  atorvastatin (LIPITOR) 10 MG tablet Take 1 tablet by mouth at bedtime. 07/08/20   [provider]  cholecalciferol (VITAMIN D3) 25 MCG (1000 UNIT) tablet Take 2,000 Units by mouth at bedtime.    [provider]  lisinopril-hydrochlorothiazide (ZESTORETIC) 20-12.5 MG tablet Take 1 tablet by mouth at bedtime. 06/23/20   [provider]  metFORMIN (GLUCOPHAGE) 500 MG tablet Take 1 tablet by mouth every evening. 05/26/20   [provider]  metoprolol succinate (TOPROL-XL) 50 MG 24 hr tablet Take 50 mg by mouth every evening. 06/17/20   [provider]  omeprazole (PRILOSEC) 40 MG capsule Take 1 capsule by mouth every evening. 06/23/20   [provider]  oxyCODONE (OXY IR/ROXICODONE) 5 MG immediate release tablet Take 1-2 tablets (5-10 mg total) by mouth every 4 (four) hours as needed for severe pain or breakthrough pain. 08/28/20   Karie Soda, MD  rOPINIRole (REQUIP) 0.25 MG tablet Take 0.25 mg by mouth at bedtime. 07/08/20   [provider]  VICTOZA 18 MG/3ML SOPN Inject 0.6 mg into the skin daily. 08/14/20   [provider]    Family History Family History  Family history unknown: Yes    Social History Social History   Tobacco Use   Smoking status: Every Day    Packs/day: 1  Types: Cigarettes   Smokeless tobacco: Never  Vaping Use   Vaping Use: Never used  Substance Use Topics   Alcohol use: Never   Drug use: Never     Allergies   Patient has no known allergies.   Review of Systems Review of Systems  Constitutional:  Positive for activity change and appetite change. Negative for fatigue and fever.  Gastrointestinal:  Positive for abdominal pain. Negative for diarrhea, nausea and vomiting.  Genitourinary:  Positive for dysuria, frequency and urgency. Negative for  genital sores, penile discharge, penile pain and penile swelling.     Physical Exam Triage Vital Signs ED Triage Vitals  Enc Vitals Group     BP 08/07/22 1010 130/87     Pulse Rate 08/07/22 1010 90     Resp 08/07/22 1010 18     Temp 08/07/22 1010 98.8 F (37.1 C)     Temp src --      SpO2 08/07/22 1010 97 %     Weight --      Height --      Head Circumference --      Peak Flow --      Pain Score 08/07/22 1008 2     Pain Loc --      Pain Edu? --      Excl. in GC? --    No data found.  Updated Vital Signs BP 130/87   Pulse 90   Temp 98.8 F (37.1 C)   Resp 18   SpO2 97%   Visual Acuity Right Eye Distance:   Left Eye Distance:   Bilateral Distance:    Right Eye Near:   Left Eye Near:    Bilateral Near:     Physical Exam Vitals reviewed.  Constitutional:      General: He is awake.     Appearance: Normal appearance. He is well-developed. He is not ill-appearing.     Comments: Very pleasant male appears stated age in no acute distress sitting comfortably in exam room  HENT:     Head: Normocephalic and atraumatic.     Mouth/Throat:     Mouth: Mucous membranes are moist.     Pharynx: Uvula midline. No oropharyngeal exudate or posterior oropharyngeal erythema.  Cardiovascular:     Rate and Rhythm: Normal rate and regular rhythm.     Heart sounds: Normal heart sounds, S1 normal and S2 normal. No murmur heard. Pulmonary:     Effort: Pulmonary effort is normal.     Breath sounds: Normal breath sounds. No stridor. No wheezing, rhonchi or rales.     Comments: Clear to auscultation bilaterally Abdominal:     General: Bowel sounds are normal.     Palpations: Abdomen is soft.     Tenderness: There is abdominal tenderness in the right lower quadrant and suprapubic area. There is no right CVA tenderness, left CVA tenderness, guarding or rebound.     Comments: Tenderness palpation throughout lower abdomen but worse in suprapubic region.  No evidence of acute infection on  physical exam.  Neurological:     Mental Status: He is alert.  Psychiatric:        Behavior: Behavior is cooperative.      UC Treatments / Results  Labs (all labs ordered are listed, but only abnormal results are displayed) Labs Reviewed  POCT URINALYSIS DIP (MANUAL ENTRY) - Abnormal; Notable for the following components:      Result Value   Bilirubin, UA moderate (*)  Ketones, POC UA small (15) (*)    Spec Grav, UA >=1.030 (*)    Blood, UA trace-intact (*)    Protein Ur, POC =30 (*)    Urobilinogen, UA 2.0 (*)    All other components within normal limits  URINE CULTURE  CBC WITH DIFFERENTIAL/PLATELET  COMPREHENSIVE METABOLIC PANEL    EKG   Radiology DG Abdomen 1 View  Result Date: 08/07/2022 CLINICAL DATA:  Lower abdominal pain with dysuria. EXAM: ABDOMEN - 1 VIEW COMPARISON:  CT abdomen pelvis dated Aug 26, 2020. FINDINGS: The bowel gas pattern is normal. No radio-opaque calculi or other significant radiographic abnormality are seen. IMPRESSION: Negative. Electronically Signed   By: Obie Dredge M.D.   On: 08/07/2022 11:21    Procedures Procedures (including critical care time)  Medications Ordered in UC Medications - No data to display  Initial Impression / Assessment and Plan / UC Course  I have reviewed the triage vital signs and the nursing notes.  Pertinent labs & imaging results that were available during my care of the patient were reviewed by me and considered in my medical decision making (see chart for details).     Patient is well-appearing, afebrile, nontoxic, nontachycardic.  Vital signs clinical exam are reassuring with no indication for emergent evaluation or imaging.  Was concentrated with evidence of decreased oral intake with no evidence of infection.  Will send this for culture but defer antibiotics until culture results are available.  KUB was obtained that was normal without evidence of radiopaque calculi.  Basic labs including CBC, CMP were  obtained and are pending.  He was encouraged to rest and drink plenty of fluid.  We will contact him if his lab work is abnormal and we need to change her treatment plan.  Recommend close follow-up with his primary care.  He was also instructed to follow-up with urology if his symptoms persist without identifiable etiology and was given contact information for local provider with instruction to call to schedule an appointment.  Discussed that if he has any worsening or changing symptoms including severe abdominal pain, focal abdominal pain, nausea, vomiting, hematuria, weakness he needs to go to the emergency room.  Strict return precautions given.  Work excuse note provided.  Final Clinical Impressions(s) / UC Diagnoses   Final diagnoses:  Lower abdominal pain  Dysuria     Discharge Instructions      Your x-ray was normal with no obvious kidney stone.  Your urine showed dehydration and that you have not been eating and drinking normally but did not show evidence of infection.  We will send this for culture and contact you if we need to start antibiotics.  I will contact you with your lab work if anything is abnormal.  Make sure that you are drinking plenty of water.  Avoid bladder irritants including caffeine.  Follow-up with urology; call to schedule an appointment.  If anything changes or worsens and you have increasing pain, fever, blood in your urine, nausea/vomiting, weakness, abdominal pain 1 part of your abdomen you need to go to the emergency room as we discussed.     ED Prescriptions   None    PDMP not reviewed this encounter.   Jeani Hawking, PA-C 08/07/22 1139

## 2022-08-08 LAB — URINE CULTURE

## 2023-04-28 IMAGING — CT CT ABD-PELV W/O CM
2 of 4 series · 16 of 46 positions shown, 18 images · non-contrast
Comparison: None.

CLINICAL DATA: Periumbilical abdominal pain, constipation.

EXAM:
CT ABDOMEN AND PELVIS WITHOUT CONTRAST
TECHNIQUE: Multidetector CT imaging of the abdomen and pelvis was performed
following the standard protocol without IV contrast.

[Series 2: axial st · axial · 0.92mm/px · z∈[+941,+1421]mm · 13 of 110 slices shown, 15 images]
[im 7/110  soft-tissue]
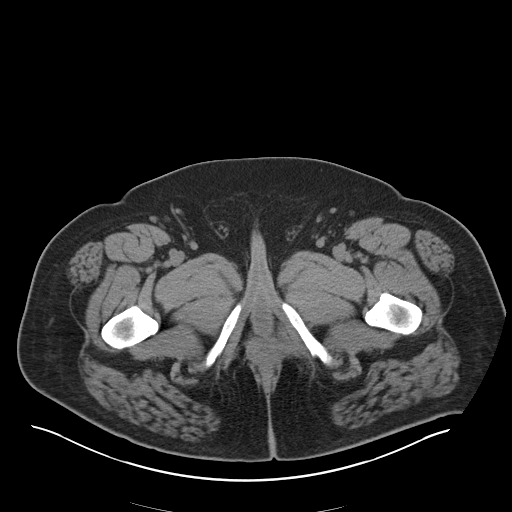
[im 7/110  bone]
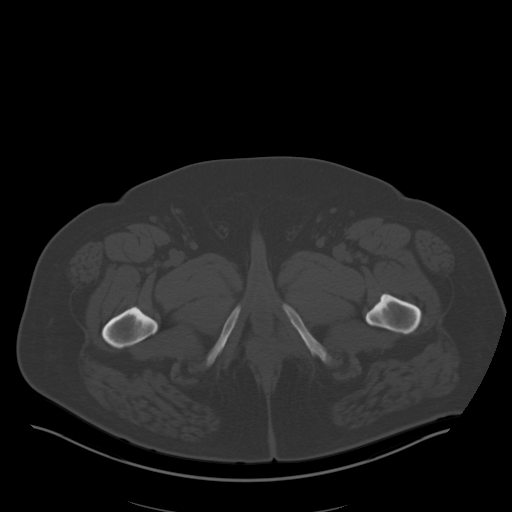
[im 13/110  soft-tissue]
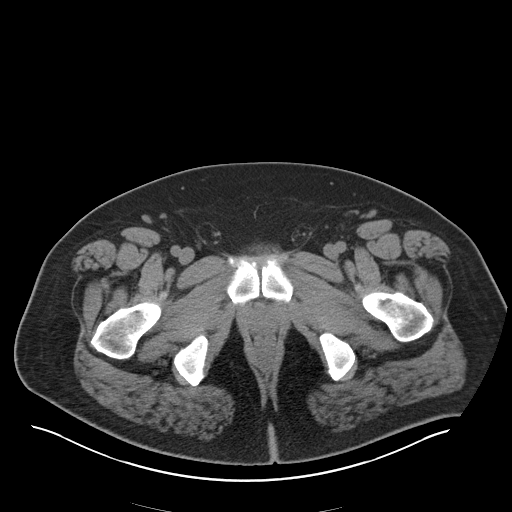
[im 26/110  soft-tissue]
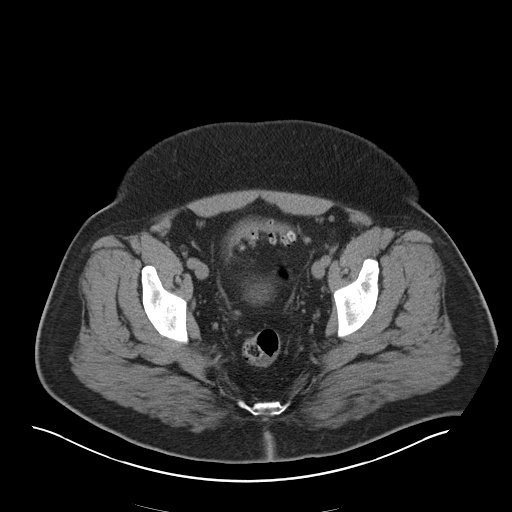
[im 33/110  soft-tissue]
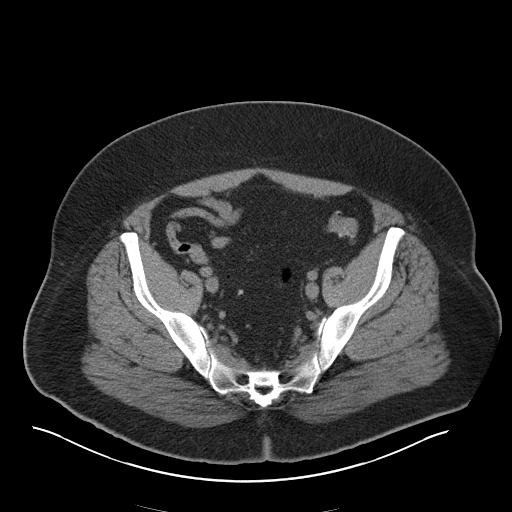
[im 39/110  soft-tissue]
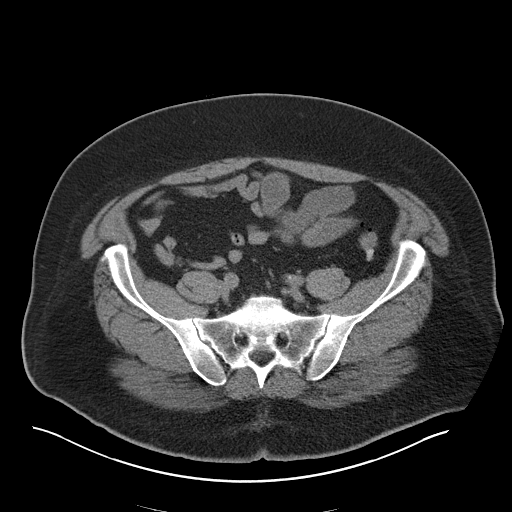
[im 45/110  soft-tissue]
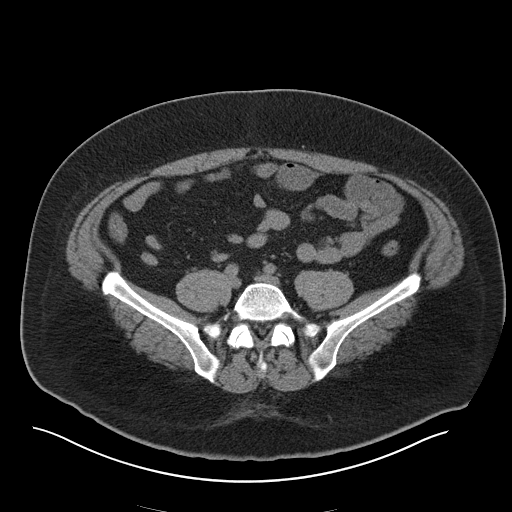
[im 58/110  soft-tissue]
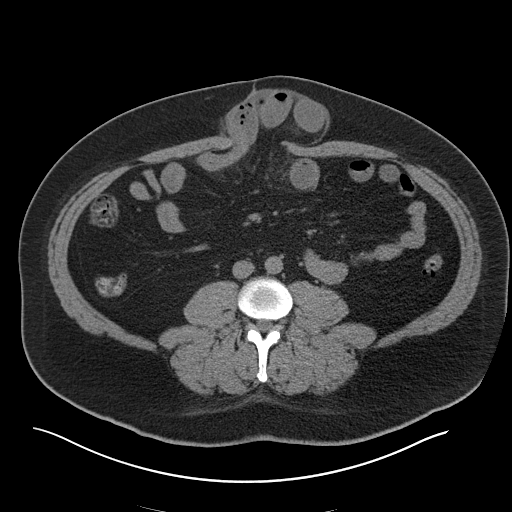
[im 65/110  soft-tissue]
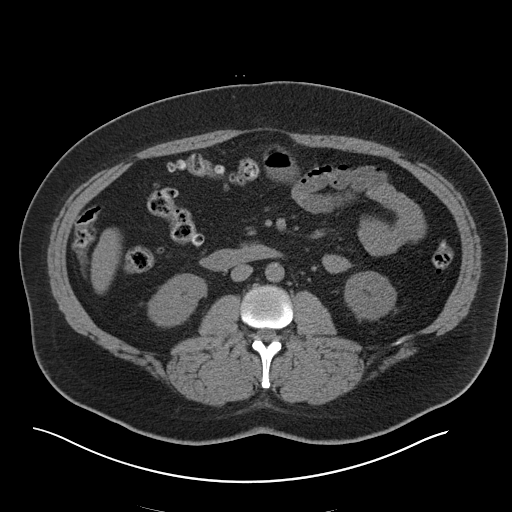
[im 71/110  soft-tissue]
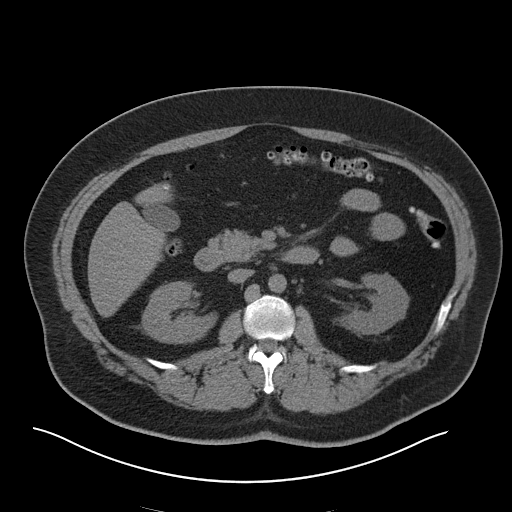
[im 71/110  bone]
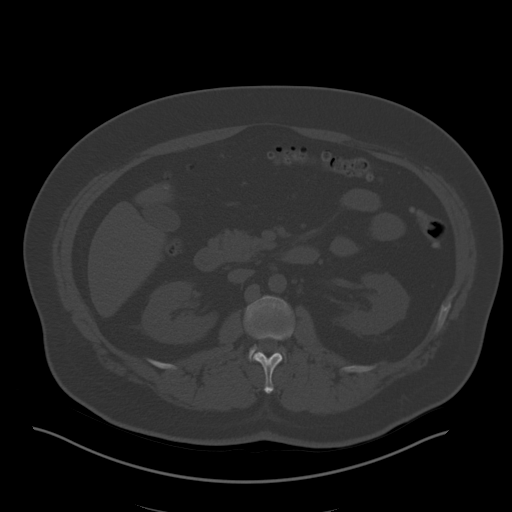
[im 77/110  soft-tissue]
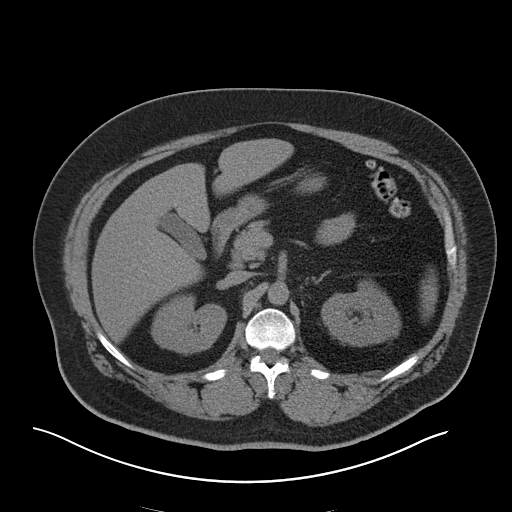
[im 84/110  soft-tissue]
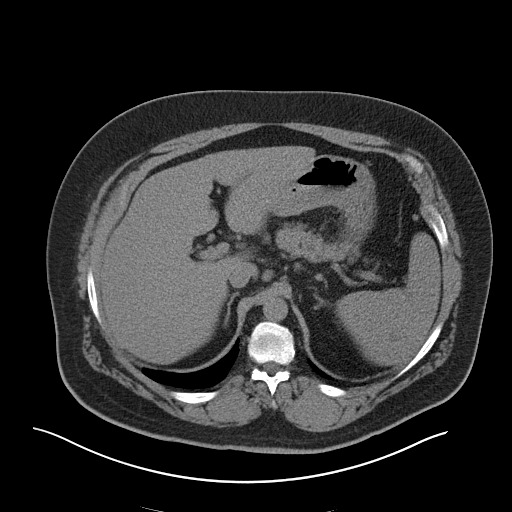
[im 97/110  soft-tissue]
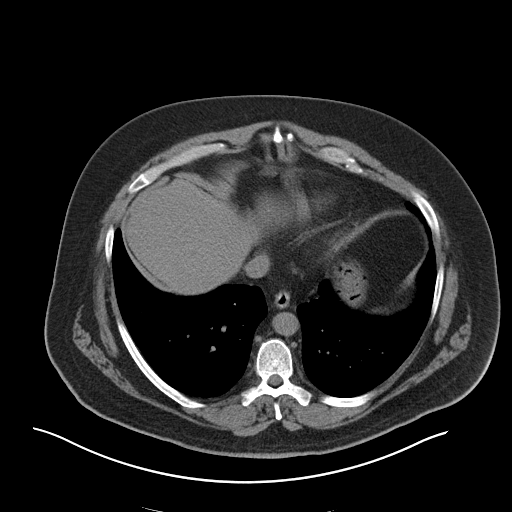
[im 103/110  soft-tissue]
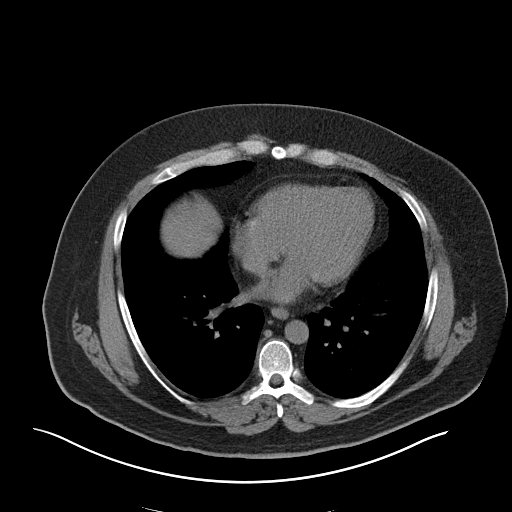

[Series 5: coronal st · coronal · 0.95mm/px · 3 of 180 slices shown]
[im 60/180  soft-tissue]
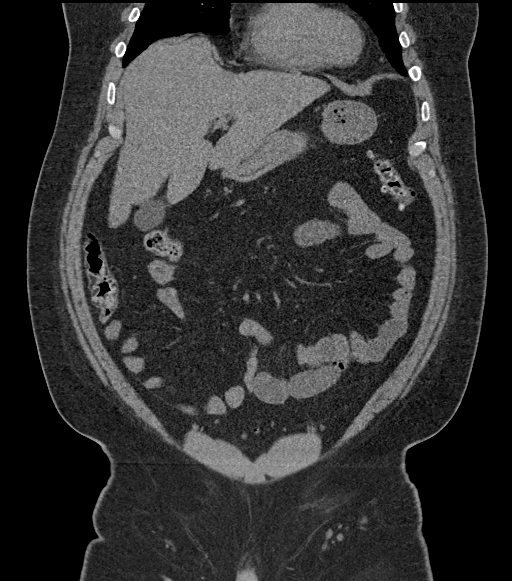
[im 80/180  soft-tissue]
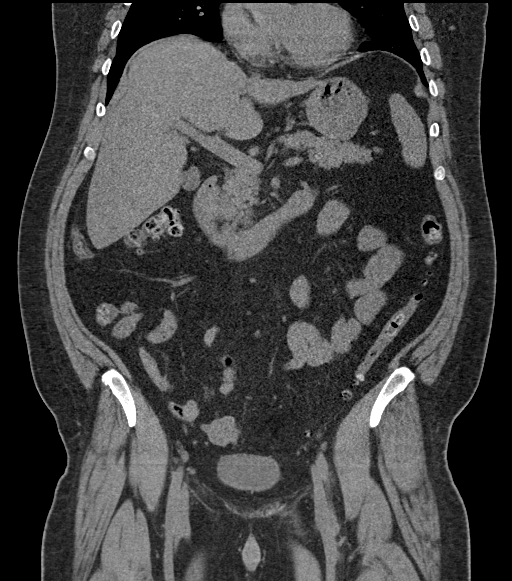
[im 100/180  soft-tissue]
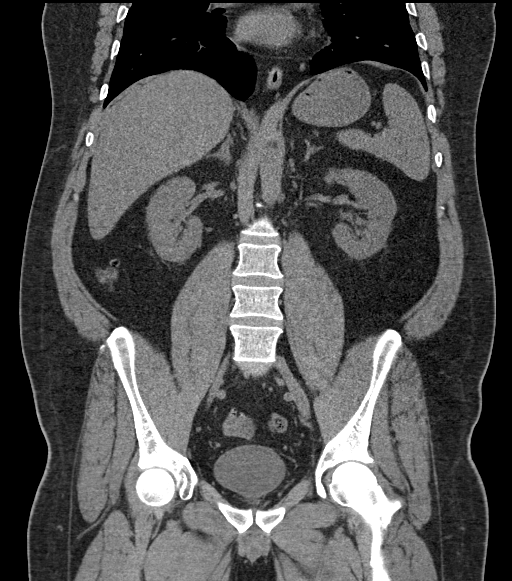

[16 of 46 positions shown; findings below may reference images not displayed]

FINDINGS: Lower chest: No acute abnormality.

Hepatobiliary: No focal liver abnormality is seen. No gallstones,
gallbladder wall thickening, or biliary dilatation.

Pancreas: Unremarkable. No pancreatic ductal dilatation or
surrounding inflammatory changes.

Spleen: Normal in size without focal abnormality.

Adrenals/Urinary Tract: Adrenal glands are unremarkable. Kidneys are
normal, without renal calculi, focal lesion, or hydronephrosis.
Bladder is unremarkable.

Stomach/Bowel: The stomach appears normal. Diverticulosis is noted
throughout the colon without inflammation. Large supraumbilical
ventral hernia is noted which contains a loop of small bowel; this
results in mild dilatation of the more proximal small bowel loops
suggesting some degree of obstruction.

Vascular/Lymphatic: No significant vascular findings are present. No
enlarged abdominal or pelvic lymph nodes.

Reproductive: Prostate is unremarkable.

Other: No ascites is noted.

Musculoskeletal: No acute or significant osseous findings.
IMPRESSION: Large supraumbilical ventral hernia is noted which contains a loop
of small bowel, which results in mild dilatation of the more
proximal small bowel loop suggesting some degree of obstruction.

Diverticulosis is noted throughout the colon without inflammation.
